# Patient Record
Sex: Male | Born: 1975 | Race: White | Hispanic: No | Smoking: Current every day smoker
Health system: Southern US, Community
[De-identification: ages and names within clinical notes are randomized; demographics above are authoritative.]

## PROBLEM LIST (undated history)

## (undated) DIAGNOSIS — S4992XA Unspecified injury of left shoulder and upper arm, initial encounter: Secondary | ICD-10-CM

## (undated) DIAGNOSIS — M549 Dorsalgia, unspecified: Secondary | ICD-10-CM

## (undated) DIAGNOSIS — G479 Sleep disorder, unspecified: Secondary | ICD-10-CM

---

## 2006-05-17 ENCOUNTER — Emergency Department (HOSPITAL_COMMUNITY): Admission: EM | Admit: 2006-05-17 | Discharge: 2006-05-17 | Payer: Self-pay | Admitting: Emergency Medicine

## 2008-06-09 ENCOUNTER — Emergency Department (HOSPITAL_COMMUNITY): Admission: EM | Admit: 2008-06-09 | Discharge: 2008-06-09 | Payer: Self-pay | Admitting: Emergency Medicine

## 2013-07-18 ENCOUNTER — Emergency Department (HOSPITAL_BASED_OUTPATIENT_CLINIC_OR_DEPARTMENT_OTHER): Payer: Self-pay

## 2013-07-18 ENCOUNTER — Emergency Department (HOSPITAL_BASED_OUTPATIENT_CLINIC_OR_DEPARTMENT_OTHER)
Admission: EM | Admit: 2013-07-18 | Discharge: 2013-07-18 | Disposition: A | Discharge status: Home / Self Care | Payer: Self-pay | Attending: Emergency Medicine | Admitting: Emergency Medicine

## 2013-07-18 ENCOUNTER — Encounter (HOSPITAL_BASED_OUTPATIENT_CLINIC_OR_DEPARTMENT_OTHER): Payer: Self-pay | Admitting: Emergency Medicine

## 2013-07-18 DIAGNOSIS — W010XXA Fall on same level from slipping, tripping and stumbling without subsequent striking against object, initial encounter: Secondary | ICD-10-CM | POA: Insufficient documentation

## 2013-07-18 DIAGNOSIS — Y9289 Other specified places as the place of occurrence of the external cause: Secondary | ICD-10-CM | POA: Insufficient documentation

## 2013-07-18 DIAGNOSIS — Y939 Activity, unspecified: Secondary | ICD-10-CM | POA: Insufficient documentation

## 2013-07-18 DIAGNOSIS — F17200 Nicotine dependence, unspecified, uncomplicated: Secondary | ICD-10-CM | POA: Insufficient documentation

## 2013-07-18 DIAGNOSIS — Y990 Civilian activity done for income or pay: Secondary | ICD-10-CM | POA: Insufficient documentation

## 2013-07-18 DIAGNOSIS — S336XXA Sprain of sacroiliac joint, initial encounter: Secondary | ICD-10-CM | POA: Insufficient documentation

## 2013-07-18 DIAGNOSIS — Z79899 Other long term (current) drug therapy: Secondary | ICD-10-CM | POA: Insufficient documentation

## 2013-07-18 HISTORY — DX: Unspecified injury of left shoulder and upper arm, initial encounter: S49.92XA

## 2013-07-18 MED ORDER — OXYCODONE-ACETAMINOPHEN 5-325 MG PO TABS
2.0000 | ORAL_TABLET | Freq: Once | ORAL | Status: AC
  Administered 2013-07-18: 2 via ORAL
  Filled 2013-07-18: qty 2

## 2013-07-18 MED ORDER — CYCLOBENZAPRINE HCL 10 MG PO TABS: 10.0000 mg | ORAL_TABLET | Freq: Three times a day (TID) | ORAL | Status: DC | PRN

## 2013-07-18 NOTE — ED Provider Notes (Signed)
CSN: [id number]     Arrival date & time 07/18/13  0818 History   First MD Initiated Contact with Patient 07/18/13 [phone]     Chief Complaint  Patient presents with  . Hip Pain   HPI  Brett Stephens is 38 y.o. male presented to the ED after sustaining a fall 3 days ago. He complains of right hip pain 7/10. He describe the pain as a sharp pain in his hip joint that shoot down into his anterior thigh. He report decreased ROM with extension and abduction. Pain is constant . Nothing makes it better, he has tried ibuprofen without relief. He feels his pain is gradually becoming worse.  Movement makes his pain worse. He reports numbness and weakness in his right leg today. He does not recall how he landed on his hip. He plays baseball recreationally and has had an injury to similar location. Patient is on chronic pain and flexeril medications for back pain.   Past Medical History  Diagnosis Date  . Injury of shoulder, left    History reviewed. No pertinent past surgical history. No family history on file. History  Substance Use Topics  . Smoking status: Current Every Day Smoker -- 1.00 packs/day    Types: Cigarettes  . Smokeless tobacco: Not on file  . Alcohol Use: No    Review of Systems  Constitutional: Positive for activity change. Negative for fever, chills and appetite change.  Musculoskeletal: Positive for arthralgias, back pain, gait problem and myalgias. Negative for joint swelling.  Skin: Negative for color change.  Neurological: Positive for weakness and numbness.    Allergies  Review of patient's allergies indicates no known allergies.  Home Medications   Current Outpatient Rx  Name  Route  Sig  Dispense  Refill  . cyclobenzaprine (FLEXERIL) 10 MG tablet   Oral   Take 10 mg by mouth 3 (three) times daily as needed for muscle spasms.         . cyclobenzaprine (FLEXERIL) 10 MG tablet   Oral   Take 1 tablet (10 mg total) by mouth 3 (three) times daily as needed for muscle  spasms.   30 tablet   0   . OxyCODONE HCl ER 30 MG T12A   Oral   Take by mouth.          BP 137/86  Pulse 83  Temp(Src) 97.8 F (36.6 C)  Resp 15  SpO2 99% Physical Exam Gen: NAD. Appears in pain. Uncomfortable sitting.  HEENT: AT. [place]. Bilateral eyes without injections or icterus. MMM.  CV: RRR  Chest: CTAB, no wheeze or crackles Abd: Soft. NTND. BS present. No Masses palpated.  Ext: No erythema. No edema.  Skin: No rashes, purpura or petechiae. Many tattoos.  Neuro: Limp. PERLA. EOMi. Alert. Grossly intact.  MSK: 5/5 strength bilateral LE. Normal ROM with pain. TTP over iliolumbar ligament and right sacrospinous ligament/dorsal sacroiliac . Normal sensation. No bony tenderness lumbar or greater trochanter hip.    ED Course  Procedures (including critical care time) Labs Review Labs Reviewed - No data to display Imaging Review Dg Lumbar Spine Complete  07/18/2013   CLINICAL DATA:  Fall at work 3 days ago. Back pain. Pain radiating down the right leg.  EXAM: LUMBAR SPINE - COMPLETE 4+ VIEW  COMPARISON:  None.  FINDINGS: There are 5 lumbar type vertebral bodies. Lumbar spinal alignment within normal limits. Vertebral body height is preserved. No pars defects are present. Mild disc space narrowing at L4-L5. SI joints  appear within normal limits.  IMPRESSION: Mild L4-L5 disc degeneration.  No acute abnormality.   Electronically Signed   By: Andreas Newport M.D.   On: 07/18/2013 09:03   Dg Hip Complete Right  07/18/2013   CLINICAL DATA:  Fall 3 days ago.  EXAM: RIGHT HIP - COMPLETE 2+ VIEW  COMPARISON:  No comparisons  FINDINGS: There is no evidence of hip fracture or dislocation. There is no evidence of arthropathy or other focal bone abnormality.  IMPRESSION: Negative.   Electronically Signed   By: Andreas Newport M.D.   On: 07/18/2013 09:04     EKG Interpretation None      MDM   Final diagnoses:  Strain of sacroiliac ligament   Patient with likely ligament and muscle  strain. DG xray were without fracture, dislocation or acute process. Patient was given 2 percocet for pain with improvement while in ED. Pt is on chronic pain medications, no additional pain medications were given today. Flexeril was prescribed.  Patient encouraged to follow up with PCP within a week if no improvement. Patient encouraged to rest hip, daily light stretches and light duty at work for 1 week. Patient reports he needs to work and there is no light duty he can perform.     Natalia Leatherwood, DO 07/18/13 [phone]

## 2013-07-18 NOTE — ED Notes (Signed)
MD Wickline at bedside for evaluation. 

## 2013-07-18 NOTE — ED Notes (Signed)
Patient transported to X-ray 

## 2013-07-18 NOTE — ED Notes (Signed)
Pt returned to room via stretcher.

## 2013-07-18 NOTE — ED Provider Notes (Signed)
I have personally seen and examined the patient.  I have discussed the plan of care with the resident.  I have reviewed the documentation on PMH/FH/Soc. History.  I have reviewed the documentation of the resident and agree.   Joya Gaskinsonald W Emslee Lopezmartinez, MD 07/18/13 [phone]

## 2013-07-18 NOTE — Discharge Instructions (Signed)
Strain A strain is an injury to a muscle or the tissue that connects muscles to bones (tendon). In a strain injury, the muscle or tendon is either stretched or torn. Muscles are more susceptible to strains if they cross two joints, such as:  Hamstrings.  Quadriceps.  Calves.  Biceps. There are three categories of strains:  A first-degree strain is a small tear in the muscle. There is no lengthening of the muscle, but pain may be present with contraction of the muscle.  A second-degree strain is a small tear in the muscle accompanied by lengthening of the muscle. Muscles with a second-degree strain are still able to function.  A third-degree strain is a complete tear of the muscle. Muscles with a third-degree strain cannot function properly. Strains often have bleeding and bruising within the muscle. SYMPTOMS   Pain, tenderness, redness or bruising, and swelling in the area of injury.  Loss of normal mobility of the injured joint. CAUSES  A sudden force exerted on a muscle or tendon that it cannot withstand usually causes strains. This may be due to a sudden overload of a contracted muscle, overuse, or sudden increase or change in activity.  RISK INCREASES WITH:  Trauma.  Poor strength and flexibility.  Failure to warm-up properly before activity.  Return to activity before healing is complete. PREVENTION  Warm-up and stretch properly before and activity.  Maintain physical fitness:  Joint flexibility.  Muscle strength.  Endurance and conditioning.  Strengthen weak muscles with exercises to prevent recurrence. PROGNOSIS  If treated properly, strains are usually curable. The time it takes to recover is related to the severity of the injury and usually varies from 2 to 8 weeks. RELATED COMPLICATIONS   Re-injury or recurrence of symptoms, permanent weakness.  Joint stiffness if the strain is severe and rehabilitation is incomplete.  Delayed healing or resolution of  symptoms if sports are resumed before rehabilitation is complete.  Excessive bleeding into muscle, especially if taking anti-inflammatory medicines. This can lead to delayed recovery and injury to nerves, muscle, and blood vessels; this is an emergency. TREATMENT  Treatment initially involves ice and medicine to help reduce pain and inflammation. Use of the affected muscle should be limited by a:  Brace.  Elastic bandage wrapping.  Splint.  Cast.  Sling. Strengthening and stretching exercises may be necessary after immobilization to prevent joint stiffness. These exercises may be completed at home or with a therapist. If the tendon is torn, then surgery may be necessary to repair it.  MEDICATION   Avoid aspirin or ibuprofen in the first 48 hours after the injury. These medicines may increase the tendency to bleed. During this time, you may take pain relievers, such as acetaminophen, that do not affect bleeding.  After the first 48 hours, if pain medicine is necessary, then nonsteroidal anti-inflammatory medicines, such as aspirin and ibuprofen, or other minor pain relievers, such as acetaminophen, are often recommended.  Do not take pain medicine within 7 days before surgery.  Prescription pain relievers may be prescribed. Use only as directed and only as much as you need  Ointments applied to the skin may be helpful. HEAT AND COLD  Cold treatment (icing) relieves pain and reduces inflammation. Cold treatment should be applied for 10 to 15 minutes every 2 to 3 hours for inflammation and pain and immediately after any activity that aggravates your symptoms. Use ice packs or massage the area with a piece of ice (ice massage).  Heat treatment may be   used prior to performing the stretching and strengthening activities prescribed by your caregiver, physical therapist, or athletic trainer. Use a heat pack or soak your injury in warm water. SEEK MEDICAL CARE IF:   Symptoms get worse or do  not improve despite treatment.  Pain becomes intolerable.  You experience numbness or tingling.  Toes or fingernails become cold or develop a blue, gray, or dusky color.  New, unexplained symptoms develop (drugs used in treatment may produce side effects). Document Released: 05/01/2005 Document Revised: 07/24/2011 Document Reviewed: 08/13/2008 Exit[employer] Patient Information 2014 [employer].  

## 2013-07-18 NOTE — ED Provider Notes (Signed)
Patient seen/examined in the Emergency Department in conjunction with [facility] Patient reports right hip pain s/p fall Exam : awake/alert, maex4, no hip deformity noted Plan: imaging and likely d/c home Narcotic database reviewed   Joya Gaskinsonald W Julyssa Kyer, MD 07/18/13 0900

## 2013-07-18 NOTE — ED Notes (Signed)
MD at bedside. 

## 2013-07-18 NOTE — ED Notes (Signed)
Pt reports falling at work 3 days ago where he slipped on mud then fell onto the ground.  Pt woke up this morning with right hip pain that radiates down right leg.  Pt ambulated back to room with a limp.

## 2013-07-29 ENCOUNTER — Ambulatory Visit (HOSPITAL_BASED_OUTPATIENT_CLINIC_OR_DEPARTMENT_OTHER)
Admission: RE | Admit: 2013-07-29 | Discharge: 2013-07-29 | Disposition: A | Discharge status: Home / Self Care | Payer: Self-pay | Source: Ambulatory Visit | Attending: Emergency Medicine | Admitting: Emergency Medicine

## 2013-07-29 ENCOUNTER — Other Ambulatory Visit (HOSPITAL_BASED_OUTPATIENT_CLINIC_OR_DEPARTMENT_OTHER): Payer: Self-pay | Admitting: Emergency Medicine

## 2013-07-29 ENCOUNTER — Emergency Department (HOSPITAL_BASED_OUTPATIENT_CLINIC_OR_DEPARTMENT_OTHER)
Admission: EM | Admit: 2013-07-29 | Discharge: 2013-07-29 | Disposition: A | Discharge status: Home / Self Care | Payer: Self-pay | Attending: Emergency Medicine | Admitting: Emergency Medicine

## 2013-07-29 ENCOUNTER — Encounter (HOSPITAL_BASED_OUTPATIENT_CLINIC_OR_DEPARTMENT_OTHER): Payer: Self-pay | Admitting: Emergency Medicine

## 2013-07-29 DIAGNOSIS — S3994XA Unspecified injury of external genitals, initial encounter: Secondary | ICD-10-CM

## 2013-07-29 DIAGNOSIS — S39848A Other specified injuries of external genitals, initial encounter: Secondary | ICD-10-CM | POA: Insufficient documentation

## 2013-07-29 DIAGNOSIS — N5082 Scrotal pain: Secondary | ICD-10-CM

## 2013-07-29 DIAGNOSIS — Y929 Unspecified place or not applicable: Secondary | ICD-10-CM | POA: Insufficient documentation

## 2013-07-29 DIAGNOSIS — Y939 Activity, unspecified: Secondary | ICD-10-CM | POA: Insufficient documentation

## 2013-07-29 DIAGNOSIS — IMO0002 Reserved for concepts with insufficient information to code with codable children: Secondary | ICD-10-CM | POA: Insufficient documentation

## 2013-07-29 DIAGNOSIS — N508 Other specified disorders of male genital organs: Secondary | ICD-10-CM | POA: Insufficient documentation

## 2013-07-29 DIAGNOSIS — Z87828 Personal history of other (healed) physical injury and trauma: Secondary | ICD-10-CM | POA: Insufficient documentation

## 2013-07-29 DIAGNOSIS — Z79899 Other long term (current) drug therapy: Secondary | ICD-10-CM | POA: Insufficient documentation

## 2013-07-29 DIAGNOSIS — F17200 Nicotine dependence, unspecified, uncomplicated: Secondary | ICD-10-CM | POA: Insufficient documentation

## 2013-07-29 NOTE — ED Notes (Signed)
Dog jumped on pt yesterday  Left testicle swollen and painful

## 2013-07-29 NOTE — ED Notes (Signed)
Dog jumped on pt yesterday hitting left testicle  Increased pain and swelling

## 2013-07-29 NOTE — ED Provider Notes (Signed)
CSN: [id number]     Arrival date & time 07/29/13  [id number] History   First MD Initiated Contact with Patient 07/29/13 0531     Chief Complaint  Patient presents with  . Testicle Injury     (Consider location/radiation/quality/duration/timing/severity/associated sxs/prior Treatment) HPI This is a [age] year old male who was struck in the left scrotum by his dog yesterday afternoon. He is subsequently developed pain and swelling of the left hemiscrotum. The pain is moderate and worse with palpation or movement. He is not having hematuria. He has had sexual intercourse since the injury. He denies any abdominal pain, nausea or vomiting. He is having some mild pain in his left spermatic cord.  Past Medical History  Diagnosis Date  . Injury of shoulder, left    History reviewed. No pertinent past surgical history. No family history on file. History  Substance Use Topics  . Smoking status: Current Every Day Smoker -- 1.00 packs/day    Types: Cigarettes  . Smokeless tobacco: Not on file  . Alcohol Use: No    Review of Systems  All other systems reviewed and are negative.      Allergies  Review of patient's allergies indicates no known allergies.  Home Medications   Current Outpatient Rx  Name  Route  Sig  Dispense  Refill  . cyclobenzaprine (FLEXERIL) 10 MG tablet   Oral   Take 10 mg by mouth 3 (three) times daily as needed for muscle spasms.         . cyclobenzaprine (FLEXERIL) 10 MG tablet   Oral   Take 1 tablet (10 mg total) by mouth 3 (three) times daily as needed for muscle spasms.   30 tablet   0   . OxyCODONE HCl ER 30 MG T12A   Oral   Take by mouth.          BP 141/94  Pulse 109  Temp(Src) 98.2 F (36.8 C) (Oral)  Resp 16  SpO2 97%  Physical Exam General: Well-developed, well-nourished male in no acute distress; appearance consistent with age of record HENT: normocephalic; atraumatic Eyes: pupils equal, round and reactive to light; extraocular muscles  intact Neck: supple Heart: regular rate and rhythm Lungs: clear to auscultation bilaterally Abdomen: soft; nondistended; nontender; no masses or hepatosplenomegaly; bowel sounds present GU: Tanner 4 male, circumcised; no blood or discharge at urethral meatus; right testicle and hemiscrotum normal; left testicle palpable within the edematous or fluid filled left hemiscrotum; no hernia palpated; left testicle mild to moderately tender, no obvious torsion Extremities: No deformity; full range of motion Neurologic: Awake, alert and oriented; motor function intact in all extremities and symmetric; no facial droop Skin: Warm and dry Psychiatric: Normal mood and affect    ED Course  Procedures (including critical care time)    MDM  The patient is mild to moderate discomfort makes me less concerned about testicular rupture or hematoma of the testicle itself. We will however obtain an ultrasound of his scrotum this morning. We could not perform a bedside ultrasound because the machine is out of order.      Hanley SeamenJohn L Reign Bartnick, MD 07/29/13 [phone]

## 2013-07-31 ENCOUNTER — Emergency Department (HOSPITAL_BASED_OUTPATIENT_CLINIC_OR_DEPARTMENT_OTHER): Payer: Self-pay

## 2013-07-31 ENCOUNTER — Encounter (HOSPITAL_BASED_OUTPATIENT_CLINIC_OR_DEPARTMENT_OTHER): Payer: Self-pay | Admitting: Emergency Medicine

## 2013-07-31 ENCOUNTER — Emergency Department (HOSPITAL_BASED_OUTPATIENT_CLINIC_OR_DEPARTMENT_OTHER)
Admission: EM | Admit: 2013-07-31 | Discharge: 2013-07-31 | Disposition: A | Discharge status: Home / Self Care | Payer: Self-pay | Attending: Emergency Medicine | Admitting: Emergency Medicine

## 2013-07-31 DIAGNOSIS — F17200 Nicotine dependence, unspecified, uncomplicated: Secondary | ICD-10-CM | POA: Insufficient documentation

## 2013-07-31 DIAGNOSIS — Z87828 Personal history of other (healed) physical injury and trauma: Secondary | ICD-10-CM | POA: Insufficient documentation

## 2013-07-31 DIAGNOSIS — N451 Epididymitis: Secondary | ICD-10-CM

## 2013-07-31 DIAGNOSIS — N453 Epididymo-orchitis: Secondary | ICD-10-CM | POA: Insufficient documentation

## 2013-07-31 LAB — BASIC METABOLIC PANEL
BUN: 15 mg/dL (ref 6–23)
CHLORIDE: 98 meq/L (ref 96–112)
CO2: 26 mEq/L (ref 19–32)
Calcium: 9.6 mg/dL (ref 8.4–10.5)
Creatinine, Ser: 1.1 mg/dL (ref 0.50–1.35)
GFR, EST NON AFRICAN AMERICAN: 86 mL/min — AB (ref >90)
GLUCOSE: 178 mg/dL — AB (ref 70–99)
POTASSIUM: 3.8 meq/L (ref 3.7–5.3)
SODIUM: 141 meq/L (ref 137–147)

## 2013-07-31 LAB — URINALYSIS, ROUTINE W REFLEX MICROSCOPIC
BILIRUBIN URINE: NEGATIVE
Glucose, UA: NEGATIVE mg/dL
Hgb urine dipstick: NEGATIVE
Ketones, ur: NEGATIVE mg/dL
NITRITE: NEGATIVE
PH: 6 (ref 5.0–8.0)
Protein, ur: NEGATIVE mg/dL
SPECIFIC GRAVITY, URINE: 1.024 (ref 1.005–1.030)
UROBILINOGEN UA: 0.2 mg/dL (ref 0.0–1.0)

## 2013-07-31 LAB — CBC WITH DIFFERENTIAL/PLATELET
BASOS PCT: 0 % (ref 0–1)
Basophils Absolute: 0 10*3/uL (ref 0.0–0.1)
EOS ABS: 0.1 10*3/uL (ref 0.0–0.7)
Eosinophils Relative: 2 % (ref 0–5)
HCT: 43.5 % (ref 39.0–52.0)
HEMOGLOBIN: 14.8 g/dL (ref 13.0–17.0)
LYMPHS ABS: 1.6 10*3/uL (ref 0.7–4.0)
Lymphocytes Relative: 16 % (ref 12–46)
MCH: 29.2 pg (ref 26.0–34.0)
MCHC: 34 g/dL (ref 30.0–36.0)
MCV: 85.8 fL (ref 78.0–100.0)
MONOS PCT: 10 % (ref 3–12)
Monocytes Absolute: 0.9 10*3/uL (ref 0.1–1.0)
NEUTROS ABS: 7 10*3/uL (ref 1.7–7.7)
NEUTROS PCT: 72 % (ref 43–77)
PLATELETS: 177 10*3/uL (ref 150–400)
RBC: 5.07 MIL/uL (ref 4.22–5.81)
RDW: 13.9 % (ref 11.5–15.5)
WBC: 9.7 10*3/uL (ref 4.0–10.5)

## 2013-07-31 LAB — URINE MICROSCOPIC-ADD ON

## 2013-07-31 MED ORDER — DEXTROSE 5 % IV SOLN
1.0000 g | Freq: Once | INTRAVENOUS | Status: AC
  Administered 2013-07-31: 1 g via INTRAVENOUS

## 2013-07-31 MED ORDER — HYDROMORPHONE HCL PF 1 MG/ML IJ SOLN
1.0000 mg | Freq: Once | INTRAMUSCULAR | Status: AC
  Administered 2013-07-31: 1 mg via INTRAVENOUS
  Filled 2013-07-31: qty 1

## 2013-07-31 MED ORDER — DOXYCYCLINE HYCLATE 100 MG PO CAPS: 100.0000 mg | ORAL_CAPSULE | Freq: Two times a day (BID) | ORAL | Status: DC

## 2013-07-31 MED ORDER — CEPHALEXIN 500 MG PO CAPS: 500.0000 mg | ORAL_CAPSULE | Freq: Four times a day (QID) | ORAL | Status: DC

## 2013-07-31 MED ORDER — ONDANSETRON HCL 4 MG/2ML IJ SOLN
4.0000 mg | Freq: Once | INTRAMUSCULAR | Status: AC
  Administered 2013-07-31: 4 mg via INTRAVENOUS
  Filled 2013-07-31: qty 2

## 2013-07-31 MED ORDER — CEFTRIAXONE SODIUM 1 G IJ SOLR
INTRAMUSCULAR | Status: AC
  Filled 2013-07-31: qty 10

## 2013-07-31 MED ORDER — HYDROMORPHONE HCL PF 2 MG/ML IJ SOLN
2.0000 mg | Freq: Once | INTRAMUSCULAR | Status: AC
  Administered 2013-07-31: 2 mg via INTRAVENOUS
  Filled 2013-07-31: qty 1

## 2013-07-31 MED ORDER — IBUPROFEN 800 MG PO TABS: 800.0000 mg | ORAL_TABLET | Freq: Three times a day (TID) | ORAL | Status: DC

## 2013-07-31 MED ORDER — KETOROLAC TROMETHAMINE 30 MG/ML IJ SOLN
30.0000 mg | Freq: Once | INTRAMUSCULAR | Status: AC
  Administered 2013-07-31: 30 mg via INTRAVENOUS
  Filled 2013-07-31: qty 1

## 2013-07-31 NOTE — ED Provider Notes (Signed)
CSN: [id number]     Arrival date & time 07/31/13  0848 History   First MD Initiated Contact with Patient 07/31/13 [phone]     Chief Complaint  Patient presents with  . Testicle Pain     (Consider location/radiation/quality/duration/timing/severity/associated sxs/prior Treatment) HPI Comments: Patient returns to the ER for persistent and worsening left testicle pain. Patient suffered an injury to the testicle 3 days ago. Patient reports that his daughter took upon him and hit his left testicle. He was seen in the ER and treated with analgesia. Patient reports that the pain, redness and swelling of the left testicle has worsened since that time. He denies urethral discharge. He has not had any dysuria.  Patient is a 38 y.o. male presenting with testicular pain.  Testicle Pain    Past Medical History  Diagnosis Date  . Injury of shoulder, left    History reviewed. No pertinent past surgical history. No family history on file. History  Substance Use Topics  . Smoking status: Current Every Day Smoker -- 1.00 packs/day    Types: Cigarettes  . Smokeless tobacco: Not on file  . Alcohol Use: No    Review of Systems  Genitourinary: Positive for testicular pain.  All other systems reviewed and are negative.      Allergies  Review of patient's allergies indicates no known allergies.  Home Medications   Current Outpatient Rx  Name  Route  Sig  Dispense  Refill  . cyclobenzaprine (FLEXERIL) 10 MG tablet   Oral   Take 10 mg by mouth 3 (three) times daily as needed for muscle spasms.         . cyclobenzaprine (FLEXERIL) 10 MG tablet   Oral   Take 1 tablet (10 mg total) by mouth 3 (three) times daily as needed for muscle spasms.   30 tablet   0   . OxyCODONE HCl ER 30 MG T12A   Oral   Take by mouth.          BP 172/86  Pulse 115  Temp(Src) 99.4 F (37.4 C) (Oral)  Resp 20  Ht 6\' 5"  (1.956 m)  Wt 220 lb (99.791 kg)  BMI 26.08 kg/m2  SpO2 99% Physical Exam    Constitutional: He is oriented to person, place, and time. He appears well-developed and well-nourished. He appears distressed.  HENT:  Head: Normocephalic and atraumatic.  Right Ear: Hearing normal.  Left Ear: Hearing normal.  Nose: Nose normal.  Mouth/Throat: Oropharynx is clear and moist and mucous membranes are normal.  Eyes: Conjunctivae and EOM are normal. Pupils are equal, round, and reactive to light.  Neck: Normal range of motion. Neck supple.  Cardiovascular: Regular rhythm, S1 normal and S2 normal.  Exam reveals no gallop and no friction rub.   No murmur heard. Pulmonary/Chest: Effort normal and breath sounds normal. No respiratory distress. He exhibits no tenderness.  Abdominal: Soft. Normal appearance and bowel sounds are normal. There is no hepatosplenomegaly. There is no tenderness. There is no rebound, no guarding, no tenderness at McBurney's point and negative Murphy's sign. No hernia. Hernia confirmed negative in the left inguinal area.  Genitourinary:    Left testis shows swelling and tenderness. Left testis shows no mass.  Musculoskeletal: Normal range of motion.  Neurological: He is alert and oriented to person, place, and time. He has normal strength. No cranial nerve deficit or sensory deficit. Coordination normal. GCS eye subscore is 4. GCS verbal subscore is 5. GCS motor subscore is 6.  Skin: Skin is warm, dry and intact. No rash noted. No cyanosis.  Psychiatric: He has a normal mood and affect. His speech is normal and behavior is normal. Thought content normal.    ED Course  Procedures (including critical care time) Labs Review Labs Reviewed  CBC WITH DIFFERENTIAL  BASIC METABOLIC PANEL   Imaging Review [place] Scrotum  07/29/2013   CLINICAL DATA:  Left testicular trauma yesterday  EXAM: SCROTAL ULTRASOUND  DOPPLER ULTRASOUND OF THE TESTICLES  TECHNIQUE: Complete ultrasound examination of the testicles, epididymis, and other scrotal structures was performed.  Color and spectral Doppler ultrasound were also utilized to evaluate blood flow to the testicles.  COMPARISON:  None.  FINDINGS: Right testicle  Measurements: 4.7 x 2.4 x 3.0 cm. No mass or microlithiasis visualized.  Left testicle  Measurements: 4.2 x 2.6 x 3.0 cm. No mass or microlithiasis visualized.  Right epididymis:  Normal in size and appearance.  Left epididymis:  Enlarged and hypervascular.  Hydrocele:  Physiologic fluid bilaterally.  Varicocele:  None visualized.  Pulsed Doppler interrogation of both testes demonstrates low resistance arterial and venous waveforms bilaterally.  IMPRESSION: Enlarged/hypervascular left epididymis, suspicious for epididymitis, likely post-traumatic given the clinical history.  No evidence of testicular torsion.   Electronically Signed   By: Charline BillsSriyesh  Krishnan M.D.   On: 07/29/2013 09:24   [place] Art/ven Flow Abd Pelv Doppler  07/29/2013   CLINICAL DATA:  Left testicular trauma yesterday  EXAM: SCROTAL ULTRASOUND  DOPPLER ULTRASOUND OF THE TESTICLES  TECHNIQUE: Complete ultrasound examination of the testicles, epididymis, and other scrotal structures was performed. Color and spectral Doppler ultrasound were also utilized to evaluate blood flow to the testicles.  COMPARISON:  None.  FINDINGS: Right testicle  Measurements: 4.7 x 2.4 x 3.0 cm. No mass or microlithiasis visualized.  Left testicle  Measurements: 4.2 x 2.6 x 3.0 cm. No mass or microlithiasis visualized.  Right epididymis:  Normal in size and appearance.  Left epididymis:  Enlarged and hypervascular.  Hydrocele:  Physiologic fluid bilaterally.  Varicocele:  None visualized.  Pulsed Doppler interrogation of both testes demonstrates low resistance arterial and venous waveforms bilaterally.  IMPRESSION: Enlarged/hypervascular left epididymis, suspicious for epididymitis, likely post-traumatic given the clinical history.  No evidence of testicular torsion.   Electronically Signed   By: Charline BillsSriyesh  Krishnan M.D.   On:  07/29/2013 09:24     EKG Interpretation None      MDM   Final diagnoses:  None    Patient presents to the ER for evaluation of testicular pain. Patient was seen 2 days ago for left testicle pain. This seemed to start after the patient's dog and hit his testicle. An ultrasound was performed and there were no acute abnormalities other than increased flow to the epididymis. At that time it was thought that that was posttraumatic. Patient's symptoms have worsened. He is now experiencing some redness, swelling and thickening of the rectum overlying the left testicle as well as significant testicle pain and tenderness. Repeat ultrasound once again shows normal blood flow, no evidence of torsion. This has to be considered as possible infectious at this point. Patient will be initiated on antibiotic coverage. He was administered Rocephin here in the ER and will be prescribed doxycycline and keflex. Followup with urology.    Gilda Creasehristopher J. Pollina, MD 07/31/13 [phone]

## 2013-07-31 NOTE — Discharge Instructions (Signed)
Epididymitis  Epididymitis is a swelling (inflammation) of the epididymis. The epididymis is a cord-like structure along the back part of the testicle. Epididymitis is usually, but not always, caused by infection. This is usually a sudden problem beginning with chills, fever and pain behind the scrotum and in the testicle. There may be swelling and redness of the testicle.  DIAGNOSIS   Physical examination will reveal a tender, swollen epididymis. Sometimes, cultures are obtained from the urine or from prostate secretions to help find out if there is an infection or if the cause is a different problem. Sometimes, blood work is performed to see if your white blood cell count is elevated and if a germ (bacterial) or viral infection is present. Using this knowledge, an appropriate medicine which kills germs (antibiotic) can be chosen by your caregiver. A viral infection causing epididymitis will most often go away (resolve) without treatment.  HOME CARE INSTRUCTIONS   · Hot sitz baths for 20 minutes, 4 times per day, may help relieve pain.  · Only take over-the-counter or prescription medicines for pain, discomfort or fever as directed by your caregiver.  · Take all medicines, including antibiotics, as directed. Take the antibiotics for the full prescribed length of time even if you are feeling better.  · It is very important to keep all follow-up appointments.  SEEK IMMEDIATE MEDICAL CARE IF:   · You have a fever.  · You have pain not relieved with medicines.  · You have any worsening of your problems.  · Your pain seems to come and go.  · You develop pain, redness, and swelling in the scrotum and surrounding areas.  MAKE SURE YOU:   · Understand these instructions.  · Will watch your condition.  · Will get help right away if you are not doing well or get worse.  Document Released: 04/28/2000 Document Revised: 07/24/2011 Document Reviewed: 03/18/2009  [employer]® Patient Information ©2014 [employer].

## 2013-07-31 NOTE — ED Notes (Signed)
Patient states he was seen on Tuesday for L side testicle pain, and still having swelling and pain.

## 2013-10-23 ENCOUNTER — Encounter (HOSPITAL_BASED_OUTPATIENT_CLINIC_OR_DEPARTMENT_OTHER): Payer: Self-pay | Admitting: Emergency Medicine

## 2013-10-23 ENCOUNTER — Emergency Department (HOSPITAL_BASED_OUTPATIENT_CLINIC_OR_DEPARTMENT_OTHER)
Admission: EM | Admit: 2013-10-23 | Discharge: 2013-10-24 | Disposition: A | Discharge status: Home / Self Care | Payer: Self-pay | Attending: Emergency Medicine | Admitting: Emergency Medicine

## 2013-10-23 ENCOUNTER — Emergency Department (HOSPITAL_BASED_OUTPATIENT_CLINIC_OR_DEPARTMENT_OTHER)
Admission: EM | Admit: 2013-10-23 | Discharge: 2013-10-23 | Discharge status: Against Medical Advice | Payer: Self-pay | Attending: Emergency Medicine | Admitting: Emergency Medicine

## 2013-10-23 ENCOUNTER — Emergency Department (HOSPITAL_BASED_OUTPATIENT_CLINIC_OR_DEPARTMENT_OTHER): Payer: Self-pay

## 2013-10-23 DIAGNOSIS — Y9364 Activity, baseball: Secondary | ICD-10-CM | POA: Insufficient documentation

## 2013-10-23 DIAGNOSIS — Z87828 Personal history of other (healed) physical injury and trauma: Secondary | ICD-10-CM | POA: Insufficient documentation

## 2013-10-23 DIAGNOSIS — X500XXA Overexertion from strenuous movement or load, initial encounter: Secondary | ICD-10-CM | POA: Insufficient documentation

## 2013-10-23 DIAGNOSIS — X58XXXA Exposure to other specified factors, initial encounter: Secondary | ICD-10-CM | POA: Insufficient documentation

## 2013-10-23 DIAGNOSIS — S8990XA Unspecified injury of unspecified lower leg, initial encounter: Secondary | ICD-10-CM | POA: Insufficient documentation

## 2013-10-23 DIAGNOSIS — F17200 Nicotine dependence, unspecified, uncomplicated: Secondary | ICD-10-CM | POA: Insufficient documentation

## 2013-10-23 DIAGNOSIS — Y92838 Other recreation area as the place of occurrence of the external cause: Secondary | ICD-10-CM

## 2013-10-23 DIAGNOSIS — S99929A Unspecified injury of unspecified foot, initial encounter: Principal | ICD-10-CM

## 2013-10-23 DIAGNOSIS — Y929 Unspecified place or not applicable: Secondary | ICD-10-CM | POA: Insufficient documentation

## 2013-10-23 DIAGNOSIS — S92109A Unspecified fracture of unspecified talus, initial encounter for closed fracture: Secondary | ICD-10-CM | POA: Insufficient documentation

## 2013-10-23 DIAGNOSIS — Y9239 Other specified sports and athletic area as the place of occurrence of the external cause: Secondary | ICD-10-CM | POA: Insufficient documentation

## 2013-10-23 DIAGNOSIS — S92151A Displaced avulsion fracture (chip fracture) of right talus, initial encounter for closed fracture: Secondary | ICD-10-CM

## 2013-10-23 DIAGNOSIS — Z79899 Other long term (current) drug therapy: Secondary | ICD-10-CM | POA: Insufficient documentation

## 2013-10-23 DIAGNOSIS — S99919A Unspecified injury of unspecified ankle, initial encounter: Principal | ICD-10-CM

## 2013-10-23 HISTORY — DX: Sleep disorder, unspecified: G47.9

## 2013-10-23 HISTORY — DX: Dorsalgia, unspecified: M54.9

## 2013-10-23 MED ORDER — IBUPROFEN 800 MG PO TABS
800.0000 mg | ORAL_TABLET | Freq: Once | ORAL | Status: AC
  Administered 2013-10-24: 800 mg via ORAL
  Filled 2013-10-23: qty 1

## 2013-10-23 NOTE — ED Notes (Addendum)
Right ankle injury while playing baseball tonight.  Was here earlier and LWBS after xr.  Called back due to confirmation of fx.

## 2013-10-23 NOTE — Discharge Instructions (Signed)
Ankle Fracture °A fracture is a break in a bone. A cast or splint may be used to protect the ankle and heal the break. Sometimes, surgery is needed. °HOME CARE °· Use crutches as told by your doctor. It is very important that you use your crutches correctly. °· Do not put weight or pressure on the injured ankle until told by your doctor. °· Keep your ankle raised (elevated) when sitting or lying down. °· Apply ice to the ankle: °· Put ice in a plastic bag. °· Place a towel between your cast and the bag. °· Leave the ice on for 20 minutes, 2 3 times a day. °· If you have a plaster or fiberglass cast: °· Do not try to scratch under the cast with any objects. °· Check the skin around the cast every day. You may put lotion on red or sore areas. °· Keep your cast dry and clean. °· If you have a plaster splint: °· Wear the splint as told by your doctor. °· You can loosen the elastic around the splint if your toes get numb, tingle, or turn cold or blue. °· Do not put pressure on any part of your cast or splint. It may break. Rest your plaster splint or cast only on a pillow the first 24 hours until it is fully hardened. °· Cover your cast or splint with a plastic bag during showers. °· Do not lower your cast or splint into water. °· Take medicine as told by your doctor. °· Do not drive until your doctor says it is safe. °· Follow-up with your doctor as told. It is very important that you go to your follow-up visits. °GET HELP IF: °The swelling and discomfort gets worse.  °GET HELP RIGHT AWAY IF:  °· Your splint or cast breaks. °· You continue to have very bad pain. °· You have new pain or swelling after your splint or cast was put on. °· Your skin or toes below the injured ankle: °· Turn blue or gray. °· Feel cold, numb, or you cannot feel them. °· There is a bad smell or yellowish white fluid (pus) coming from under the splint or cast. °MAKE SURE YOU:  °· Understand these instructions. °· Will watch your  condition. °· Will get help right away if you are not doing well or get worse. °Document Released: 02/26/2009 Document Revised: 02/19/2013 Document Reviewed: 11/28/2012 °ExitCare® Patient Information ©2014 [employer]. ° °

## 2013-10-23 NOTE — ED Notes (Signed)
Playing baseball and twisted right ankle while running.  Pain from ankle to toes.

## 2013-10-23 NOTE — ED Provider Notes (Signed)
CSN: [id number]     Arrival date & time 10/23/13  2232 History   First MD Initiated Contact with Patient 10/23/13 2338     Chief Complaint  Patient presents with  . Ankle Injury     (Consider location/radiation/quality/duration/timing/severity/associated sxs/prior Treatment) HPI Comments: Patient playing baseball and slid into first base twisting ankle and hearing pop.  Patient with swelling and pain to lateral right ankle.   Patient is a 38 y.o. male presenting with lower extremity injury. The history is provided by the patient.  Ankle Injury This is a new problem. The current episode started 3 to 5 hours ago. The problem occurs constantly. The problem has not changed since onset.Pertinent negatives include no chest pain and no abdominal pain. The symptoms are aggravated by walking. Nothing relieves the symptoms. He has tried nothing for the symptoms. The treatment provided no relief.    Past Medical History  Diagnosis Date  . Injury of shoulder, left   . Sleep disorder   . Back pain    History reviewed. No pertinent past surgical history. No family history on file. History  Substance Use Topics  . Smoking status: Current Every Day Smoker -- 2.00 packs/day    Types: Cigarettes  . Smokeless tobacco: Not on file  . Alcohol Use: No    Review of Systems  Cardiovascular: Negative for chest pain.  Gastrointestinal: Negative for abdominal pain.  All other systems reviewed and are negative.     Allergies  Review of patient's allergies indicates no known allergies.  Home Medications   Prior to Admission medications   Medication Sig Start Date End Date Taking? Authorizing Provider  cephALEXin (KEFLEX) 500 MG capsule Take 1 capsule (500 mg total) by mouth 4 (four) times daily. 07/31/13   Gilda Crease, MD  cyclobenzaprine (FLEXERIL) 10 MG tablet Take 10 mg by mouth 3 (three) times daily as needed for muscle spasms.    Historical Provider, MD  cyclobenzaprine (FLEXERIL)  10 MG tablet Take 1 tablet (10 mg total) by mouth 3 (three) times daily as needed for muscle spasms. 07/18/13   Renee A Kuneff, DO  doxycycline (VIBRAMYCIN) 100 MG capsule Take 1 capsule (100 mg total) by mouth 2 (two) times daily. 07/31/13   Gilda Crease, MD  ibuprofen (ADVIL,MOTRIN) 800 MG tablet Take 1 tablet (800 mg total) by mouth 3 (three) times daily. 07/31/13   Gilda Crease, MD  OxyCODONE HCl ER 30 MG T12A Take by mouth.    Historical Provider, MD   There were no vitals taken for this visit. Physical Exam  Vitals reviewed. Constitutional: He appears well-developed and well-nourished.  HENT:  Head: Normocephalic and atraumatic.  Musculoskeletal:       Feet:  There were no vitals filed for this visit. There were no vitals filed for this visit. Distal pulses intact Vital signs from earlier this evening seen and reviewed.  ED Course  Procedures (including critical care time) Labs Review Labs Reviewed - No data to display  Imaging Review Dg Ankle Complete Right  10/23/2013   CLINICAL DATA:  Right ankle pain following injury  EXAM: RIGHT ANKLE - COMPLETE 3+ VIEW  COMPARISON:  None.  FINDINGS: A tiny bony densities are noted adjacent to the lateral aspect of the talus likely representing small avulsion fractures. Associated soft tissue swelling is seen. No other focal abnormality is noted.  IMPRESSION: Small avulsion fractures from the lateral talus.   Electronically Signed   By: Eulah Pont.D.  On: 10/23/2013 21:43   Dg Foot Complete Right  10/23/2013   CLINICAL DATA:  Right foot pain following injury  EXAM: RIGHT FOOT COMPLETE - 3+ VIEW  COMPARISON:  None.  FINDINGS: There is no evidence of fracture or dislocation. There is no evidence of arthropathy or other focal bone abnormality. Soft tissues are unremarkable.  IMPRESSION: No acute abnormality noted.   Electronically Signed   By: Alcide CleverMark  Lukens M.D.   On: 10/23/2013 21:44     EKG Interpretation None       MDM   Final diagnoses:  Avulsion fracture of right talus       Hilario Quarryanielle S Jamyria Ozanich, MD 10/23/13 2349

## 2013-10-24 ENCOUNTER — Ambulatory Visit (INDEPENDENT_AMBULATORY_CARE_PROVIDER_SITE_OTHER): Payer: Self-pay | Admitting: Family Medicine

## 2013-10-24 ENCOUNTER — Encounter: Payer: Self-pay | Admitting: Family Medicine

## 2013-10-24 VITALS — BP 118/81 | HR 86 | Ht 77.0 in | Wt 210.0 lb

## 2013-10-24 DIAGNOSIS — S99929A Unspecified injury of unspecified foot, initial encounter: Secondary | ICD-10-CM

## 2013-10-24 DIAGNOSIS — S99919A Unspecified injury of unspecified ankle, initial encounter: Secondary | ICD-10-CM

## 2013-10-24 DIAGNOSIS — S99921A Unspecified injury of right foot, initial encounter: Secondary | ICD-10-CM

## 2013-10-24 DIAGNOSIS — S8990XA Unspecified injury of unspecified lower leg, initial encounter: Secondary | ICD-10-CM

## 2013-10-24 NOTE — Patient Instructions (Signed)
You have an avulsion fracture of your talus. Wear cam walker when up and walking around regularly for next 6 weeks. Take your oxycodone as you have been. If it doesn't have tylenol in it take tylenol 500mg  1-2 tabs three times a day. Ibuprofen 600mg  3-4 times a day with food. Capsaicin topically up to 4 times a day for pain. Follow up with me in 6 weeks. These typically heal very well and you can put weight on this.

## 2013-10-28 ENCOUNTER — Encounter: Payer: Self-pay | Admitting: Family Medicine

## 2013-10-28 ENCOUNTER — Emergency Department (HOSPITAL_BASED_OUTPATIENT_CLINIC_OR_DEPARTMENT_OTHER)
Admission: EM | Admit: 2013-10-28 | Discharge: 2013-10-28 | Disposition: A | Discharge status: Home / Self Care | Payer: Self-pay | Attending: Emergency Medicine | Admitting: Emergency Medicine

## 2013-10-28 ENCOUNTER — Emergency Department (HOSPITAL_BASED_OUTPATIENT_CLINIC_OR_DEPARTMENT_OTHER): Payer: Self-pay

## 2013-10-28 ENCOUNTER — Encounter (HOSPITAL_BASED_OUTPATIENT_CLINIC_OR_DEPARTMENT_OTHER): Payer: Self-pay | Admitting: Emergency Medicine

## 2013-10-28 DIAGNOSIS — Y929 Unspecified place or not applicable: Secondary | ICD-10-CM | POA: Insufficient documentation

## 2013-10-28 DIAGNOSIS — X58XXXA Exposure to other specified factors, initial encounter: Secondary | ICD-10-CM | POA: Insufficient documentation

## 2013-10-28 DIAGNOSIS — Y939 Activity, unspecified: Secondary | ICD-10-CM | POA: Insufficient documentation

## 2013-10-28 DIAGNOSIS — S82899A Other fracture of unspecified lower leg, initial encounter for closed fracture: Secondary | ICD-10-CM

## 2013-10-28 DIAGNOSIS — S99921A Unspecified injury of right foot, initial encounter: Secondary | ICD-10-CM | POA: Insufficient documentation

## 2013-10-28 DIAGNOSIS — F17200 Nicotine dependence, unspecified, uncomplicated: Secondary | ICD-10-CM | POA: Insufficient documentation

## 2013-10-28 DIAGNOSIS — R21 Rash and other nonspecific skin eruption: Secondary | ICD-10-CM | POA: Insufficient documentation

## 2013-10-28 DIAGNOSIS — Z791 Long term (current) use of non-steroidal anti-inflammatories (NSAID): Secondary | ICD-10-CM | POA: Insufficient documentation

## 2013-10-28 DIAGNOSIS — S8263XA Displaced fracture of lateral malleolus of unspecified fibula, initial encounter for closed fracture: Secondary | ICD-10-CM | POA: Insufficient documentation

## 2013-10-28 DIAGNOSIS — Z792 Long term (current) use of antibiotics: Secondary | ICD-10-CM | POA: Insufficient documentation

## 2013-10-28 MED ORDER — HYDROCODONE-ACETAMINOPHEN 5-325 MG PO TABS: 1.0000 | ORAL_TABLET | Freq: Four times a day (QID) | ORAL | Status: AC | PRN

## 2013-10-28 MED ORDER — OXYCODONE-ACETAMINOPHEN 5-325 MG PO TABS
1.0000 | ORAL_TABLET | Freq: Once | ORAL | Status: AC
  Administered 2013-10-28: 1 via ORAL

## 2013-10-28 MED ORDER — IBUPROFEN 600 MG PO TABS: 600.0000 mg | ORAL_TABLET | Freq: Four times a day (QID) | ORAL | Status: AC | PRN

## 2013-10-28 MED ORDER — OXYCODONE-ACETAMINOPHEN 5-325 MG PO TABS
ORAL_TABLET | ORAL | Status: AC
  Filled 2013-10-28: qty 1

## 2013-10-28 NOTE — Discharge Instructions (Signed)
Please follow the recommendations given to you by Dr. Pearletha ForgeHudnall - otherwise you will not heal well, and have a limp and pain for rest of your life.  RICE: Routine Care for Injuries The routine care of many injuries includes Rest, Ice, Compression, and Elevation (RICE). HOME CARE INSTRUCTIONS  Rest is needed to allow your body to heal. Routine activities can usually be resumed when comfortable. Injured tendons and bones can take up to 6 weeks to heal. Tendons are the cord-like structures that attach muscle to bone.  Ice following an injury helps keep the swelling down and reduces pain.  Put ice in a plastic bag.  Place a towel between your skin and the bag.  Leave the ice on for 15-20 minutes, 03-04 times a day. Do this while awake, for the first 24 to 48 hours. After that, continue as directed by your caregiver.  Compression helps keep swelling down. It also gives support and helps with discomfort. If an elastic bandage has been applied, it should be removed and reapplied every 3 to 4 hours. It should not be applied tightly, but firmly enough to keep swelling down. Watch fingers or toes for swelling, bluish discoloration, coldness, numbness, or excessive pain. If any of these problems occur, remove the bandage and reapply loosely. Contact your caregiver if these problems continue.  Elevation helps reduce swelling and decreases pain. With extremities, such as the arms, hands, legs, and feet, the injured area should be placed near or above the level of the heart, if possible. SEEK IMMEDIATE MEDICAL CARE IF:  You have persistent pain and swelling.  You develop redness, numbness, or unexpected weakness.  Your symptoms are getting worse rather than improving after several days. These symptoms may indicate that further evaluation or further X-rays are needed. Sometimes, X-rays may not show a small broken bone (fracture) until 1 week or 10 days later. Make a follow-up appointment with your  caregiver. Ask when your X-ray results will be ready. Make sure you get your X-ray results. Document Released: 08/13/2000 Document Revised: 07/24/2011 Document Reviewed: 09/30/2010 [facility] Patient Information 2014 [place]ExitCare, [place].

## 2013-10-28 NOTE — ED Notes (Signed)
Pt seen here 6/12 FX foo tf/u with [facility] on fri , pt took off camwaker and went to work today

## 2013-10-28 NOTE — ED Provider Notes (Signed)
CSN: [id number]     Arrival date & time 10/28/13  0048 History   First MD Initiated Contact with Patient 10/28/13 0127     Chief Complaint  Patient presents with  . Foot Pain     (Consider location/radiation/quality/duration/timing/severity/associated sxs/prior Treatment) HPI Comments: Pt recently diagnosed with right ankle avulsion fracture. Discharged to Dr. Pearletha ForgeHudnall, who gave a cam walker boot to the patient. Patient reports going to work, and just applying ace wrap. Pain has increased over time this evening and so has swelling.  Patient is a 38 y.o. male presenting with lower extremity pain. The history is provided by the patient.  Foot Pain    Past Medical History  Diagnosis Date  . Injury of shoulder, left   . Sleep disorder   . Back pain    History reviewed. No pertinent past surgical history. History reviewed. No pertinent family history. History  Substance Use Topics  . Smoking status: Current Every Day Smoker -- 2.00 packs/day    Types: Cigarettes  . Smokeless tobacco: Not on file  . Alcohol Use: No    Review of Systems  Constitutional: Positive for activity change.  Musculoskeletal: Positive for arthralgias.  Skin: Positive for rash. Negative for wound.      Allergies  Review of patient's allergies indicates no known allergies.  Home Medications   Prior to Admission medications   Medication Sig Start Date End Date Taking? Authorizing Provider  cephALEXin (KEFLEX) 500 MG capsule Take 1 capsule (500 mg total) by mouth 4 (four) times daily. 07/31/13   Gilda Creasehristopher J. Pollina, MD  cyclobenzaprine (FLEXERIL) 10 MG tablet Take 10 mg by mouth 3 (three) times daily as needed for muscle spasms.    Historical Provider, MD  cyclobenzaprine (FLEXERIL) 10 MG tablet Take 1 tablet (10 mg total) by mouth 3 (three) times daily as needed for muscle spasms. 07/18/13   Renee A Kuneff, DO  doxycycline (VIBRAMYCIN) 100 MG capsule Take 1 capsule (100 mg total) by mouth 2 (two) times  daily. 07/31/13   Gilda Creasehristopher J. Pollina, MD  HYDROcodone-acetaminophen (NORCO/VICODIN) 5-325 MG per tablet Take 1 tablet by mouth every 6 (six) hours as needed. 10/28/13   Derwood KaplanAnkit Nanavati, MD  ibuprofen (ADVIL,MOTRIN) 600 MG tablet Take 1 tablet (600 mg total) by mouth every 6 (six) hours as needed. 10/28/13   Derwood KaplanAnkit Nanavati, MD  ibuprofen (ADVIL,MOTRIN) 800 MG tablet Take 1 tablet (800 mg total) by mouth 3 (three) times daily. 07/31/13   Gilda Creasehristopher J. Pollina, MD  OxyCODONE HCl ER 30 MG T12A Take by mouth.    Historical Provider, MD   BP 141/97  Pulse 95  Temp(Src) 98.3 F (36.8 C) (Oral)  Resp 18  Ht 6\' 5"  (1.956 m)  Wt 215 lb (97.523 kg)  BMI 25.49 kg/m2  SpO2 100% Physical Exam  Nursing note and vitals reviewed. Constitutional: He appears well-developed.  HENT:  Head: Atraumatic.  Eyes: Conjunctivae are normal.  Neck: Neck supple.  Cardiovascular: Intact distal pulses.   Pulmonary/Chest: Effort normal.  Musculoskeletal:  Right ankle with ecchymoses and edema. Sensory exam normal.  Neurological: He is alert.    ED Course  Procedures (including critical care time) Labs Review Labs Reviewed - No data to display  Imaging Review Dg Ankle Complete Right  10/28/2013   CLINICAL DATA:  Worsening right lateral ankle pain.  EXAM: RIGHT ANKLE - COMPLETE 3+ VIEW  COMPARISON:  Right ankle radiographs performed 10/23/2013  FINDINGS: Previously suggested avulsion fractures at the lateral aspect of the  talus again noted, though these could be chronic in nature. There is no additional evidence of fracture. The ankle mortise is grossly unremarkable in appearance. The subtalar joint is within normal limits.  No significant soft tissue abnormalities are characterized.  IMPRESSION: Previously suggested avulsion fractures of the lateral aspect of the talus again noted, though these could be chronic in nature. No additional evidence of fracture.   Electronically Signed   By: Roanna RaiderJeffery  Chang M.D.   On:  10/28/2013 02:13     EKG Interpretation None      MDM   Final diagnoses:  Avulsion fracture of ankle    Pt with known fracture. Ambulating with ace wrap and not the Cam walker boot as advised. Repeat xray unchanged. Advised to use RICE tx and CAM walker as advised. Work note provided.   Derwood KaplanAnkit Nanavati, MD 10/28/13 [phone]

## 2013-10-28 NOTE — Progress Notes (Signed)
Patient ID: Brett Stephens, male   DOB: 06/15/1975, 38 y.o.   MRN: [id number]  PCP: Pcp Not In System  Subjective:   HPI: Patient is a 38 y.o. male here for right foot injury.  Patient reports he slid into 3rd base playing baseball on 6/11. Inverted his ankle and heard a pop, difficulty bearing weight after this. Unable to continue playing. Iced, taking ibuprofen and takes oxycodone for chronic shoulder pain. He went to ED - repeat x-rays showed possible avulsion fracture of talus. Placed in a splint with crutches but he needs a boot to be able to work.  Past Medical History  Diagnosis Date  . Injury of shoulder, left   . Sleep disorder   . Back pain     Current Outpatient Prescriptions on File Prior to Visit  Medication Sig Dispense Refill  . OxyCODONE HCl ER 30 MG T12A Take by mouth.       No current facility-administered medications on file prior to visit.    History reviewed. No pertinent past surgical history.  No Known Allergies  History   Social History  . Marital Status: Married    Spouse Name: N/A    Number of Children: N/A  . Years of Education: N/A   Occupational History  . Not on file.   Social History Main Topics  . Smoking status: Current Every Day Smoker -- 2.00 packs/day    Types: Cigarettes  . Smokeless tobacco: Not on file  . Alcohol Use: No  . Drug Use: No  . Sexual Activity: Not on file   Other Topics Concern  . Not on file   Social History Narrative  . No narrative on file    No family history on file.  BP 118/81  Pulse 86  Ht 6\' 5"  (1.956 m)  Wt 210 lb (95.255 kg)  BMI 24.90 kg/m2  Review of Systems: See HPI above.    Objective:  Physical Exam:  Gen: NAD  Right foot/ankle: Mild-mod swelling, bruising lateral foot/ankle. Mod limitation ROM all directions. TTP over ATFL, lateral talus.  No lateral malleolus, medial malleolus, navicular, base 5th, other foot/ankle tenderness. 1+ ant drawer and talar tilt.  Painful talar  tilt. Negative syndesmotic compression. Thompsons test negative. NV intact distally.    Assessment & Plan:  1. Right foot injury - radiographs show very small talus avulsion fracture.  Suspect this is new or suffered a severe ankle sprain.  Will treat conservatively.  Cam walker for next 6 weeks for talus fracture.  Elevation, icing.  Take his oxycodone with ibuprofen and capsaicin as needed.  F/u in 6 weeks.  These fractures typically heal very well with conservative treatment.  Can consider PT in future.

## 2013-10-28 NOTE — Assessment & Plan Note (Signed)
radiographs show very small talus avulsion fracture.  Suspect this is new or suffered a severe ankle sprain.  Will treat conservatively.  Cam walker for next 6 weeks for talus fracture.  Elevation, icing.  Take his oxycodone with ibuprofen and capsaicin as needed.  F/u in 6 weeks.  These fractures typically heal very well with conservative treatment.  Can consider PT in future.

## 2013-12-05 ENCOUNTER — Encounter: Payer: Self-pay | Admitting: Family Medicine

## 2013-12-05 ENCOUNTER — Ambulatory Visit (INDEPENDENT_AMBULATORY_CARE_PROVIDER_SITE_OTHER): Payer: Self-pay | Admitting: Family Medicine

## 2013-12-05 VITALS — BP 122/80 | HR 94 | Ht 77.0 in | Wt 200.0 lb

## 2013-12-05 DIAGNOSIS — S99919A Unspecified injury of unspecified ankle, initial encounter: Secondary | ICD-10-CM

## 2013-12-05 DIAGNOSIS — M722 Plantar fascial fibromatosis: Secondary | ICD-10-CM

## 2013-12-05 DIAGNOSIS — S8990XA Unspecified injury of unspecified lower leg, initial encounter: Secondary | ICD-10-CM

## 2013-12-05 DIAGNOSIS — S99929A Unspecified injury of unspecified foot, initial encounter: Secondary | ICD-10-CM

## 2013-12-05 DIAGNOSIS — S99921D Unspecified injury of right foot, subsequent encounter: Secondary | ICD-10-CM

## 2013-12-05 DIAGNOSIS — Z5189 Encounter for other specified aftercare: Secondary | ICD-10-CM

## 2013-12-05 NOTE — Patient Instructions (Signed)
You have right ankle instability. Icing, aleve or ibuprofen if needed for pain and swelling. Elevate as needed for swelling though this can take months to go completely down. Use laceup ankle brace to help with stability while you recover from this injury - wear when up and walking around for next 6 weeks then only with sports after that for 6 weeks. Start theraband strengthening exercises once a day 3 sets of 10 for 6 weeks. Consider physical therapy for strengthening and balance exercises.  You have plantar fasciitis of the left foot. Take tylenol or aleve as needed for pain  Plantar fascia stretch for 20-30 seconds (do 3 of these) in morning Lowering/raise on a step exercises 3 x 10 once or twice a day - this is very important for long term recovery. Can add heel walks, toe walks forward and backward as well Ice heel for 15 minutes as needed. Avoid flat shoes/barefoot walking as much as possible. Arch straps have been shown to help with pain. Orthotics with heel lift may be helpful. Steroid injection is a consideration for short term pain relief if you are struggling. Physical therapy is also an option. Follow up with me in 6 weeks.

## 2013-12-08 ENCOUNTER — Encounter: Payer: Self-pay | Admitting: Family Medicine

## 2013-12-08 DIAGNOSIS — M722 Plantar fascial fibromatosis: Secondary | ICD-10-CM | POA: Insufficient documentation

## 2013-12-08 NOTE — Progress Notes (Signed)
Patient ID: Brett Stephens, male   DOB: 12/27/1975, 38 y.o.   MRN: [id number]  PCP: Pcp Not In System  Subjective:   HPI: Patient is a 38 y.o. male here for right foot injury.  6/12: Patient reports he slid into 3rd base playing baseball on 6/11. Inverted his ankle and heard a pop, difficulty bearing weight after this. Unable to continue playing. Iced, taking ibuprofen and takes oxycodone for chronic shoulder pain. He went to ED - repeat x-rays showed possible avulsion fracture of talus. Placed in a splint with crutches but he needs a boot to be able to work.  7/24: Patient reports right foot has improved. Stopped wearing cam walker - had more pain with this. Ankle feels unstable though - thinks he has rolled this ankle 3 times just walking. Some days with left foot pain also on bottom of this heel.  Past Medical History  Diagnosis Date  . Injury of shoulder, left   . Sleep disorder   . Back pain     Current Outpatient Prescriptions on File Prior to Visit  Medication Sig Dispense Refill  . HYDROcodone-acetaminophen (NORCO/VICODIN) 5-325 MG per tablet Take 1 tablet by mouth every 6 (six) hours as needed.  10 tablet  0  . ibuprofen (ADVIL,MOTRIN) 600 MG tablet Take 1 tablet (600 mg total) by mouth every 6 (six) hours as needed.  30 tablet  0  . OxyCODONE HCl ER 30 MG T12A Take by mouth.       No current facility-administered medications on file prior to visit.    History reviewed. No pertinent past surgical history.  No Known Allergies  History   Social History  . Marital Status: Married    Spouse Name: N/A    Number of Children: N/A  . Years of Education: N/A   Occupational History  . Not on file.   Social History Main Topics  . Smoking status: Current Every Day Smoker -- 2.00 packs/day    Types: Cigarettes  . Smokeless tobacco: Not on file  . Alcohol Use: No  . Drug Use: No  . Sexual Activity: Not on file   Other Topics Concern  . Not on file   Social  History Narrative  . No narrative on file    No family history on file.  BP 122/80  Pulse 94  Ht 6\' 5"  (1.956 m)  Wt 200 lb (90.719 kg)  BMI 23.71 kg/m2  Review of Systems: See HPI above.    Objective:  Physical Exam:  Gen: NAD  Right foot/ankle: Minimal swelling.  No bruising, other deformity. FROM. Minimal TTP over ATFL.  No talus, lateral malleolus, medial malleolus, navicular, base 5th, other foot/ankle tenderness. 1+ ant drawer and talar tilt. Negative syndesmotic compression. Thompsons test negative. NV intact distally.  Left foot/ankle: No gross deformity, swelling, ecchymoses FROM TTP anterior calcaneus at plantar fascia insertion. Negative ant drawer and talar tilt.   Negative syndesmotic compression. Negative calcaneal squeeze. Thompsons test negative. NV intact distally.    Assessment & Plan:  1. Right foot injury - No longer tender in area of small talus avulsion fracture - clinically healed or more likely sustained sprain and this is an old injury.  Problems now are due to instability - use ASO, start strengthening exercises for next 6 weeks.  Icing, nsaids, elevation.  Consider PT.  2. Left foot plantar fasciitis - arch binders provided.  Shown home exercise program to do daily.  Icing, nsaids, tylenol.  Arch supports shown to  be beneficial.  Consider injection if not improving.  F/u in 6 weeks.

## 2013-12-08 NOTE — Assessment & Plan Note (Signed)
No longer tender in area of small talus avulsion fracture - clinically healed or more likely sustained sprain and this is an old injury.  Problems now are due to instability - use ASO, start strengthening exercises for next 6 weeks.  Icing, nsaids, elevation.  Consider PT.

## 2013-12-08 NOTE — Assessment & Plan Note (Signed)
arch binders provided.  Shown home exercise program to do daily.  Icing, nsaids, tylenol.  Arch supports shown to be beneficial.  Consider injection if not improving.  F/u in 6 weeks.

## 2014-01-16 ENCOUNTER — Ambulatory Visit: Payer: Self-pay | Admitting: Family Medicine

## 2014-01-23 ENCOUNTER — Emergency Department (HOSPITAL_BASED_OUTPATIENT_CLINIC_OR_DEPARTMENT_OTHER): Payer: Self-pay

## 2014-01-23 ENCOUNTER — Encounter (HOSPITAL_BASED_OUTPATIENT_CLINIC_OR_DEPARTMENT_OTHER): Payer: Self-pay | Admitting: Emergency Medicine

## 2014-01-23 ENCOUNTER — Emergency Department (HOSPITAL_BASED_OUTPATIENT_CLINIC_OR_DEPARTMENT_OTHER)
Admission: EM | Admit: 2014-01-23 | Discharge: 2014-01-23 | Disposition: A | Discharge status: Home / Self Care | Payer: Self-pay | Attending: Emergency Medicine | Admitting: Emergency Medicine

## 2014-01-23 DIAGNOSIS — R059 Cough, unspecified: Secondary | ICD-10-CM | POA: Insufficient documentation

## 2014-01-23 DIAGNOSIS — J40 Bronchitis, not specified as acute or chronic: Secondary | ICD-10-CM | POA: Insufficient documentation

## 2014-01-23 DIAGNOSIS — Z87828 Personal history of other (healed) physical injury and trauma: Secondary | ICD-10-CM | POA: Insufficient documentation

## 2014-01-23 DIAGNOSIS — F17200 Nicotine dependence, unspecified, uncomplicated: Secondary | ICD-10-CM | POA: Insufficient documentation

## 2014-01-23 DIAGNOSIS — R05 Cough: Secondary | ICD-10-CM | POA: Insufficient documentation

## 2014-01-23 DIAGNOSIS — R079 Chest pain, unspecified: Secondary | ICD-10-CM | POA: Insufficient documentation

## 2014-01-23 DIAGNOSIS — Z79899 Other long term (current) drug therapy: Secondary | ICD-10-CM | POA: Insufficient documentation

## 2014-01-23 MED ORDER — PREDNISONE 10 MG PO TABS: ORAL_TABLET | ORAL | Status: AC

## 2014-01-23 MED ORDER — AZITHROMYCIN 250 MG PO TABS: 250.0000 mg | ORAL_TABLET | Freq: Every day | ORAL | Status: AC

## 2014-01-23 MED ORDER — ALBUTEROL SULFATE HFA 108 (90 BASE) MCG/ACT IN AERS: 1.0000 | INHALATION_SPRAY | Freq: Four times a day (QID) | RESPIRATORY_TRACT | Status: AC | PRN

## 2014-01-23 NOTE — ED Provider Notes (Signed)
CSN: [id number]     Arrival date & time 01/23/14  2057 History   First MD Initiated Contact with Patient 01/23/14 2107     Chief Complaint  Patient presents with  . Cough     (Consider location/radiation/quality/duration/timing/severity/associated sxs/prior Treatment) Patient is a 38 y.o. male presenting with cough. The history is provided by the patient. No language interpreter was used.  Cough Cough characteristics:  Productive Sputum characteristics:  Nondescript Severity:  Moderate Onset quality:  Gradual Duration:  1 week Timing:  Constant Progression:  Worsening Chronicity:  New Smoker: yes   Context: upper respiratory infection   Relieved by:  Nothing Worsened by:  Nothing tried Associated symptoms: chest pain, shortness of breath and sinus congestion   Associated symptoms: no fever     Past Medical History  Diagnosis Date  . Injury of shoulder, left   . Sleep disorder   . Back pain    History reviewed. No pertinent past surgical history. No family history on file. History  Substance Use Topics  . Smoking status: Current Every Day Smoker -- 2.00 packs/day    Types: Cigarettes  . Smokeless tobacco: Not on file  . Alcohol Use: No    Review of Systems  Constitutional: Negative for fever.  Respiratory: Positive for cough and shortness of breath.   Cardiovascular: Positive for chest pain.  All other systems reviewed and are negative.     Allergies  Review of patient's allergies indicates no known allergies.  Home Medications   Prior to Admission medications   Medication Sig Start Date End Date Taking? Authorizing Provider  HYDROcodone-acetaminophen (NORCO/VICODIN) 5-325 MG per tablet Take 1 tablet by mouth every 6 (six) hours as needed. 10/28/13   Derwood Kaplan, MD  ibuprofen (ADVIL,MOTRIN) 600 MG tablet Take 1 tablet (600 mg total) by mouth every 6 (six) hours as needed. 10/28/13   Derwood Kaplan, MD  OxyCODONE HCl ER 30 MG T12A Take by mouth.     Historical Provider, MD   BP 158/93  Pulse 110  Temp(Src) 98.4 F (36.9 C) (Oral)  Resp 20  Ht  (1.956 m)  Wt 230 lb (104.327 kg)  BMI 27.27 kg/m2  SpO2 97% Physical Exam  Nursing note and vitals reviewed. Constitutional: He is oriented to person, place, and time. He appears well-developed and well-nourished.  HENT:  Head: Normocephalic and atraumatic.  Eyes: Conjunctivae and EOM are normal. Pupils are equal, round, and reactive to light.  Neck: Normal range of motion. Neck supple.  Cardiovascular: Normal rate and normal heart sounds.   Pulmonary/Chest: He exhibits tenderness.  Abdominal: Soft.  Musculoskeletal: Normal range of motion.  Neurological: He is alert and oriented to person, place, and time. He has normal reflexes.  Skin: Skin is warm.  Psychiatric: He has a normal mood and affect.    ED Course  Procedures (including critical care time) Labs Review Labs Reviewed - No data to display  Imaging Review No results found.   EKG Interpretation None      MDM   Final diagnoses:  Bronchitis   zithromax Albuterol  Prednisone taper    Elson Areas, PA-C 01/23/14 2137

## 2014-01-23 NOTE — ED Notes (Signed)
Cough with bloody sputum for a few days. States for the past week he has had a cold.

## 2014-01-23 NOTE — ED Provider Notes (Signed)
Medical screening examination/treatment/procedure(s) were performed by non-physician practitioner and as supervising physician I was immediately available for consultation/collaboration.    Linwood Dibbles, MD 01/23/14 [phone]

## 2014-01-23 NOTE — ED Notes (Signed)
PA at bedside.

## 2014-01-23 NOTE — Discharge Instructions (Signed)
How to Use an Inhaler °Using your inhaler correctly is very important. Good technique will make sure that the medicine reaches your lungs.  °HOW TO USE AN INHALER: °1. Take the cap off the inhaler. °2. If this is the first time using your inhaler, you need to prime it. Shake the inhaler for 5 seconds. Release four puffs into the air, away from your face. Ask your doctor for help if you have questions. °3. Shake the inhaler for 5 seconds. °4. Turn the inhaler so the bottle is above the mouthpiece. °5. Put your pointer finger on top of the bottle. Your thumb holds the bottom of the inhaler. °6. Open your mouth. °7. Either hold the inhaler away from your mouth (the width of 2 fingers) or place your lips tightly around the mouthpiece. Ask your doctor which way to use your inhaler. °8. Breathe out as much air as possible. °9. Breathe in and push down on the bottle 1 time to release the medicine. You will feel the medicine go in your mouth and throat. °10. Continue to take a deep breath in very slowly. Try to fill your lungs. °11. After you have breathed in completely, hold your breath for 10 seconds. This will help the medicine to settle in your lungs. If you cannot hold your breath for 10 seconds, hold it for as long as you can before you breathe out. °12. Breathe out slowly, through pursed lips. Whistling is an example of pursed lips. °13. If your doctor has told you to take more than 1 puff, wait at least 15-30 seconds between puffs. This will help you get the best results from your medicine. Do not use the inhaler more than your doctor tells you to. °14. Put the cap back on the inhaler. °15. Follow the directions from your doctor or from the inhaler package about cleaning the inhaler. °If you use more than one inhaler, ask your doctor which inhalers to use and what order to use them in. Ask your doctor to help you figure out when you will need to refill your inhaler.  °If you use a steroid inhaler, always rinse your  mouth with water after your last puff, gargle and spit out the water. Do not swallow the water. °GET HELP IF: °· The inhaler medicine only partially helps to stop wheezing or shortness of breath. °· You are having trouble using your inhaler. °· You have some increase in thick spit (phlegm). °GET HELP RIGHT AWAY IF: °· The inhaler medicine does not help your wheezing or shortness of breath or you have tightness in your chest. °· You have dizziness, headaches, or fast heart rate. °· You have chills, fever, or night sweats. °· You have a large increase of thick spit, or your thick spit is bloody. °MAKE SURE YOU:  °· Understand these instructions. °· Will watch your condition. °· Will get help right away if you are not doing well or get worse. °Document Released: 02/08/2008 Document Revised: 02/19/2013 Document Reviewed: 11/28/2012 °[employer]® Patient Information ©2015 [employer]. This information is not intended to replace advice given to you by your health care provider. Make sure you discuss any questions you have with your health care provider. °Acute Bronchitis °Bronchitis is inflammation of the airways that extend from the windpipe into the lungs (bronchi). The inflammation often causes mucus to develop. This leads to a cough, which is the most common symptom of bronchitis.  °In acute bronchitis, the condition usually develops suddenly and goes away over   time, usually in a couple weeks. Smoking, allergies, and asthma can make bronchitis worse. Repeated episodes of bronchitis may cause further lung problems.  °CAUSES °Acute bronchitis is most often caused by the same virus that causes a cold. The virus can spread from person to person (contagious) through coughing, sneezing, and touching contaminated objects. °SIGNS AND SYMPTOMS  °· Cough.   °· Fever.   °· Coughing up mucus.   °· Body aches.   °· Chest congestion.   °· Chills.   °· Shortness of breath.   °· Sore throat.   °DIAGNOSIS  °Acute bronchitis is usually  diagnosed through a physical exam. Your health care provider will also ask you questions about your medical history. Tests, such as chest X-rays, are sometimes done to rule out other conditions.  °TREATMENT  °Acute bronchitis usually goes away in a couple weeks. Oftentimes, no medical treatment is necessary. Medicines are sometimes given for relief of fever or cough. Antibiotic medicines are usually not needed but may be prescribed in certain situations. In some cases, an inhaler may be recommended to help reduce shortness of breath and control the cough. A cool mist vaporizer may also be used to help thin bronchial secretions and make it easier to clear the chest.  °HOME CARE INSTRUCTIONS °· Get plenty of rest.   °· Drink enough fluids to keep your urine clear or pale yellow (unless you have a medical condition that requires fluid restriction). Increasing fluids may help thin your respiratory secretions (sputum) and reduce chest congestion, and it will prevent dehydration.   °· Take medicines only as directed by your health care provider. °· If you were prescribed an antibiotic medicine, finish it all even if you start to feel better. °· Avoid smoking and secondhand smoke. Exposure to cigarette smoke or irritating chemicals will make bronchitis worse. If you are a smoker, consider using nicotine gum or skin patches to help control withdrawal symptoms. Quitting smoking will help your lungs heal faster.   °· Reduce the chances of another bout of acute bronchitis by washing your hands frequently, avoiding people with cold symptoms, and trying not to touch your hands to your mouth, nose, or eyes.   °· Keep all follow-up visits as directed by your health care provider.   °SEEK MEDICAL CARE IF: °Your symptoms do not improve after 1 week of treatment.  °SEEK IMMEDIATE MEDICAL CARE IF: °· You develop an increased fever or chills.   °· You have chest pain.   °· You have severe shortness of breath. °· You have bloody sputum.    °· You develop dehydration. °· You faint or repeatedly feel like you are going to pass out. °· You develop repeated vomiting. °· You develop a severe headache. °MAKE SURE YOU:  °· Understand these instructions. °· Will watch your condition. °· Will get help right away if you are not doing well or get worse. °Document Released: 06/08/2004 Document Revised: 09/15/2013 Document Reviewed: 10/22/2012 °ExitCare® Patient Information ©2015 [employer]. This information is not intended to replace advice given to you by your health care provider. Make sure you discuss any questions you have with your health care provider. ° °

## 2014-08-19 ENCOUNTER — Emergency Department (HOSPITAL_BASED_OUTPATIENT_CLINIC_OR_DEPARTMENT_OTHER)
Admission: EM | Admit: 2014-08-19 | Discharge: 2014-08-19 | Disposition: A | Discharge status: Home / Self Care | Payer: Self-pay

## 2015-06-30 IMAGING — CR DG ANKLE COMPLETE 3+V*R*
3 series · 3 of 3 positions shown · non-contrast
Comparison: None.

CLINICAL DATA: Right ankle pain following injury

EXAM:
RIGHT ANKLE - COMPLETE 3+ VIEW

[t ankle joint ap right]
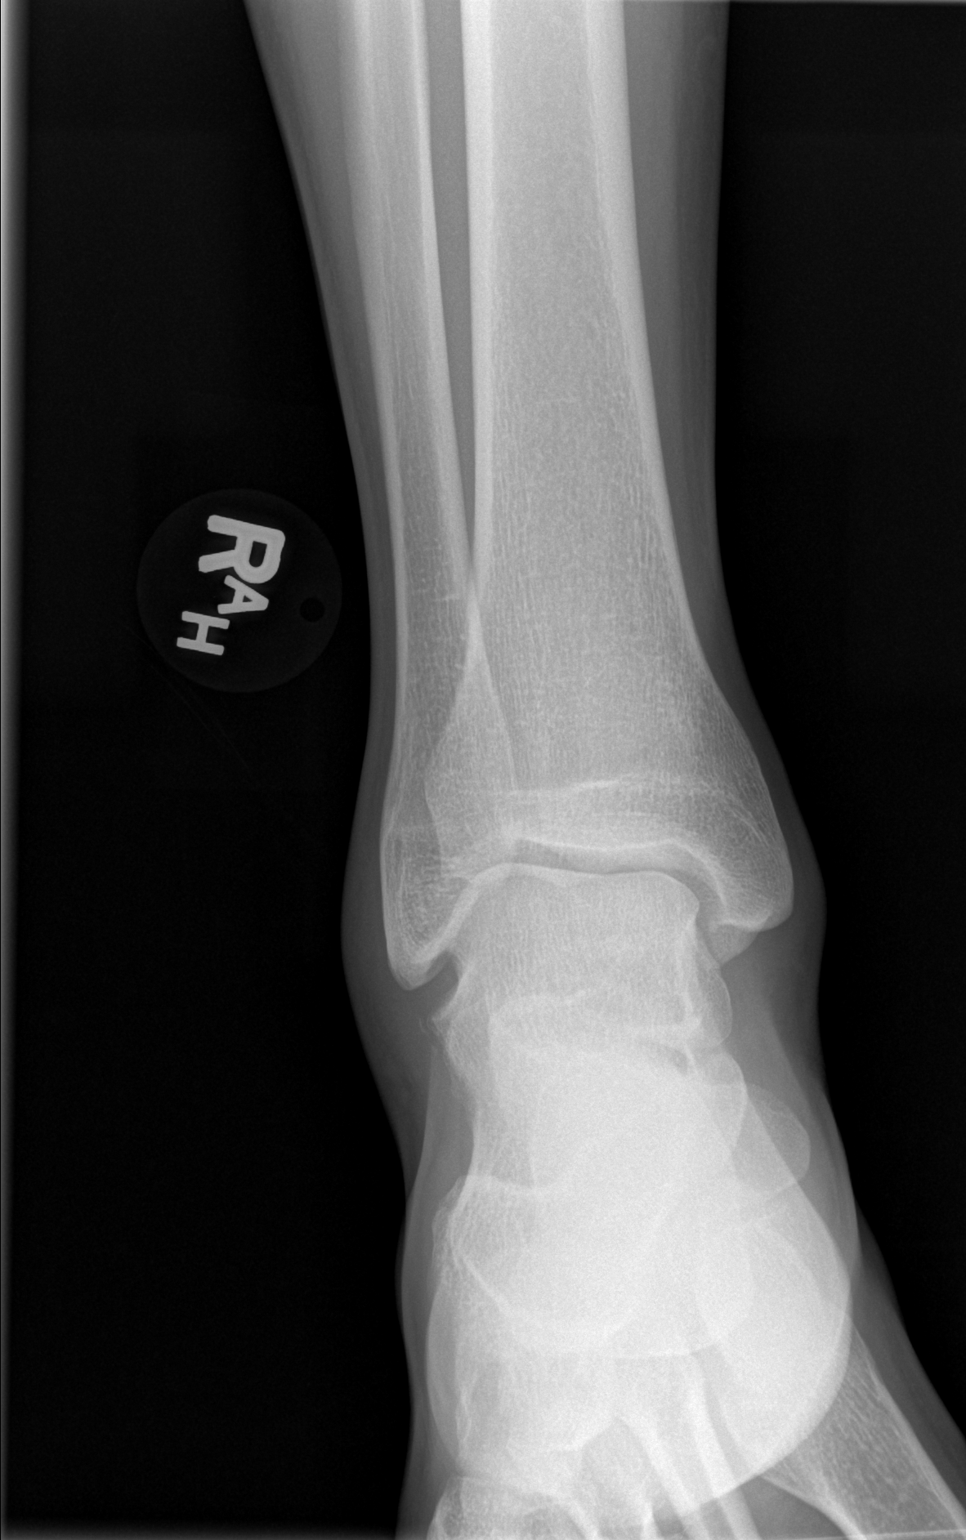

[t ankle joint oblique right]
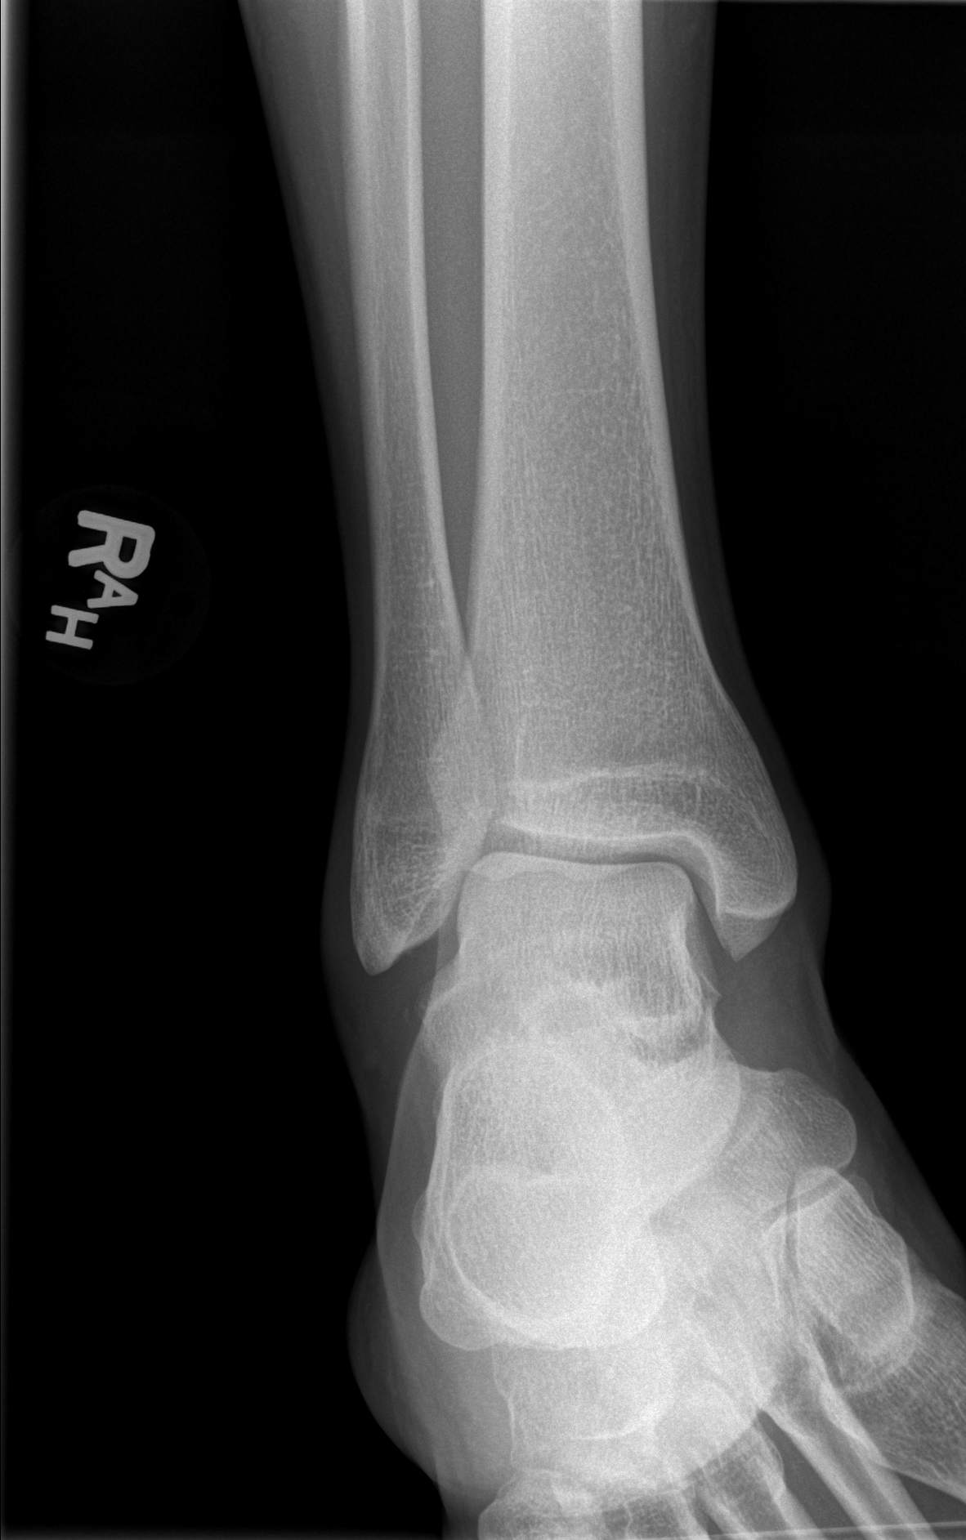

[t ankle joint lat right]
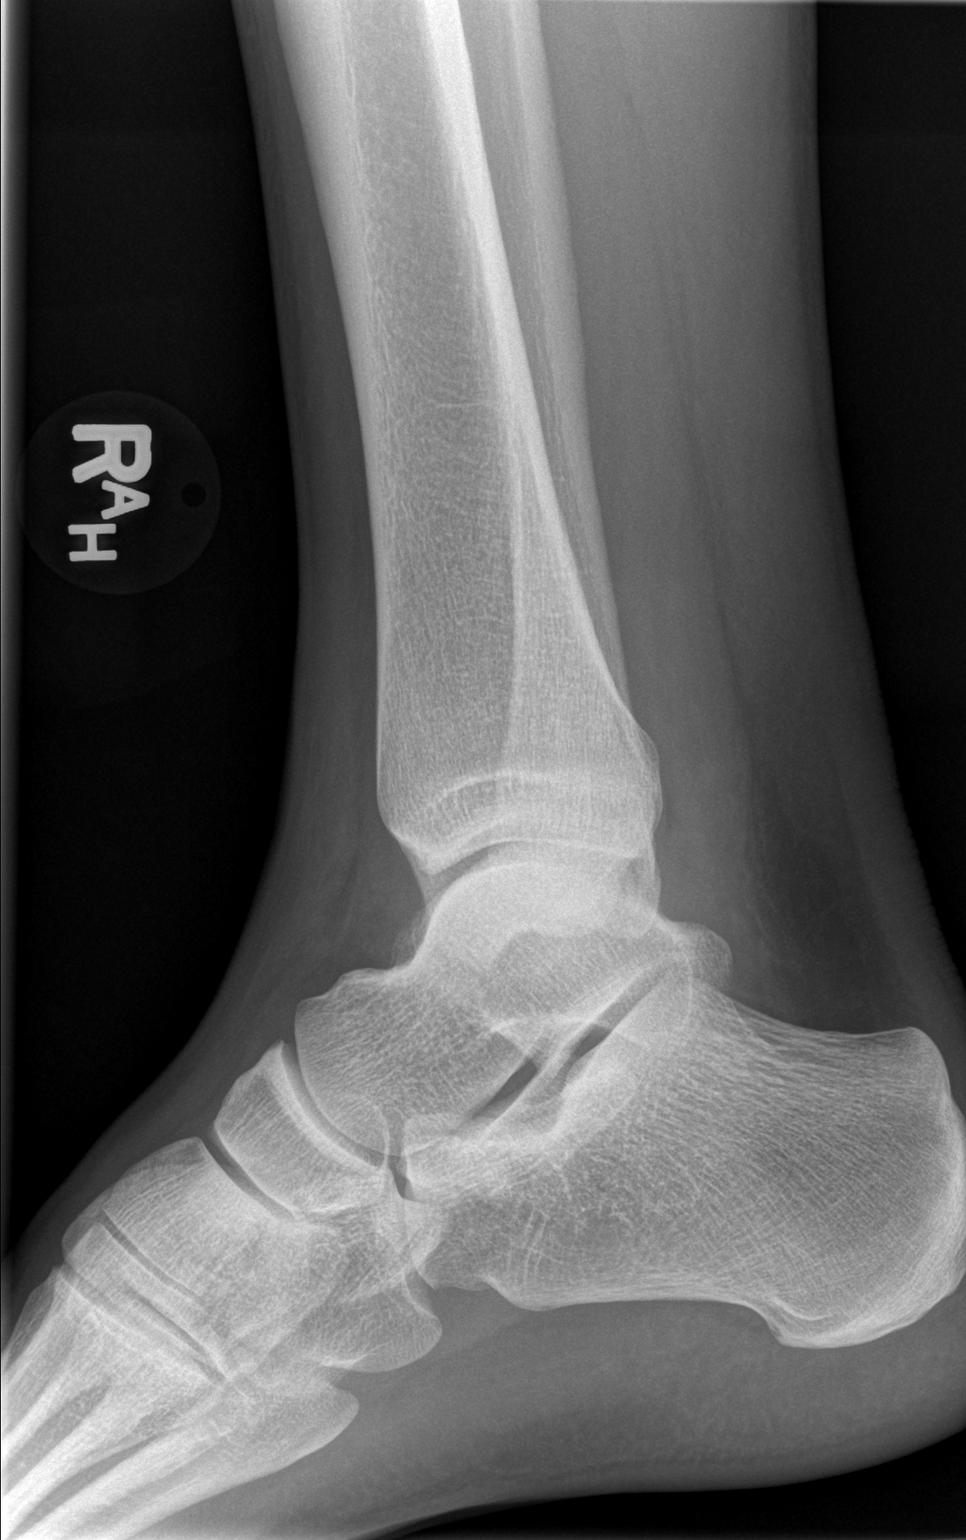

[3 of 3 positions shown; findings below may reference images not displayed]

FINDINGS: A tiny bony densities are noted adjacent to the lateral aspect of
the talus likely representing small avulsion fractures. Associated
soft tissue swelling is seen. No other focal abnormality is noted.
IMPRESSION: Small avulsion fractures from the lateral talus.

## 2015-07-05 IMAGING — CR DG ANKLE COMPLETE 3+V*R*
3 series · 3 of 3 positions shown · non-contrast
Comparison: Right ankle radiographs performed 10/23/2013

CLINICAL DATA: Worsening right lateral ankle pain.

EXAM:
RIGHT ANKLE - COMPLETE 3+ VIEW

[t ankle joint ap right]
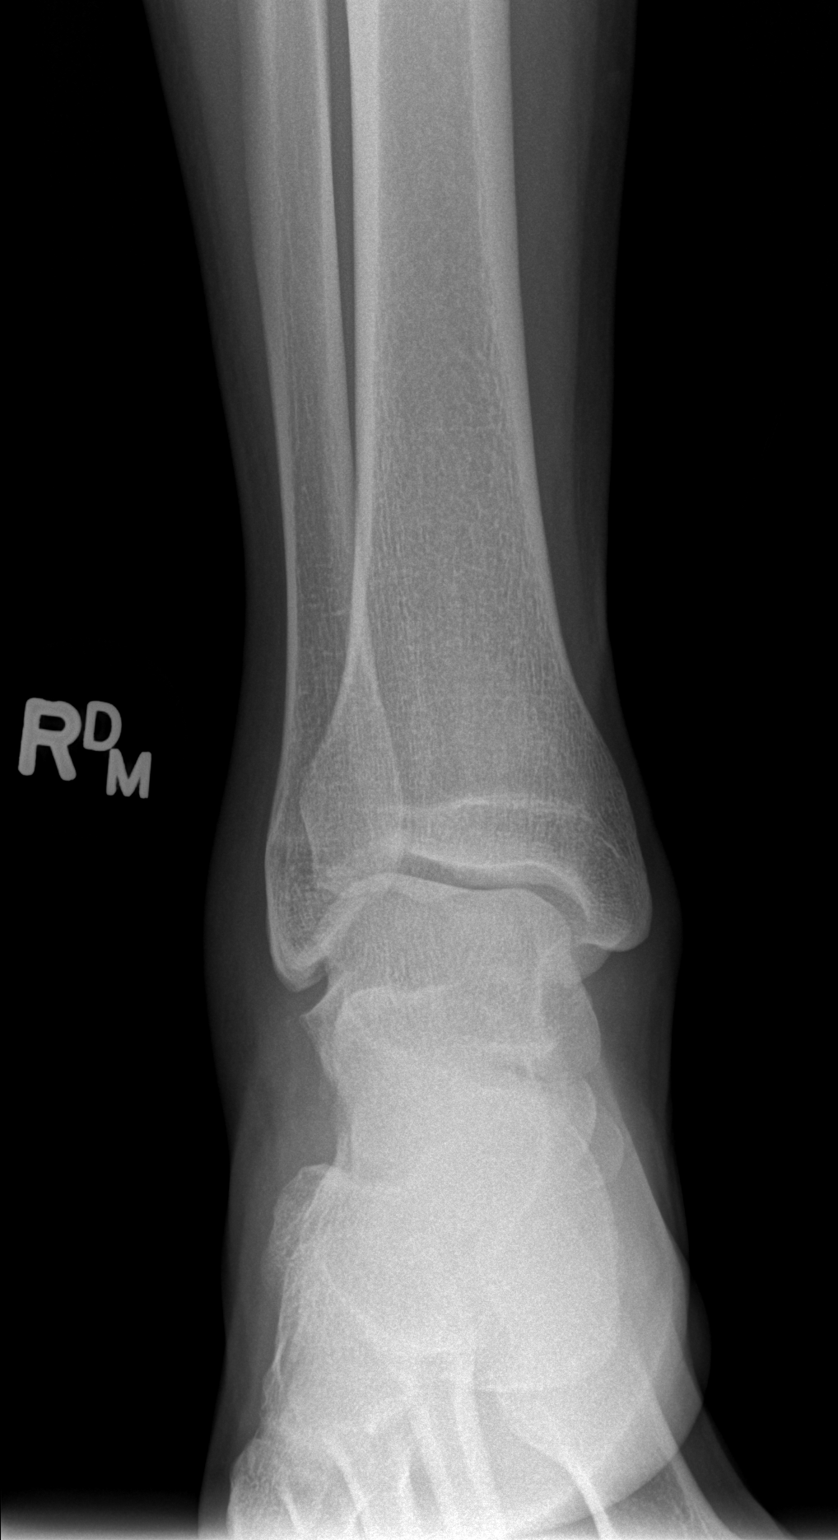

[t ankle joint oblique right]
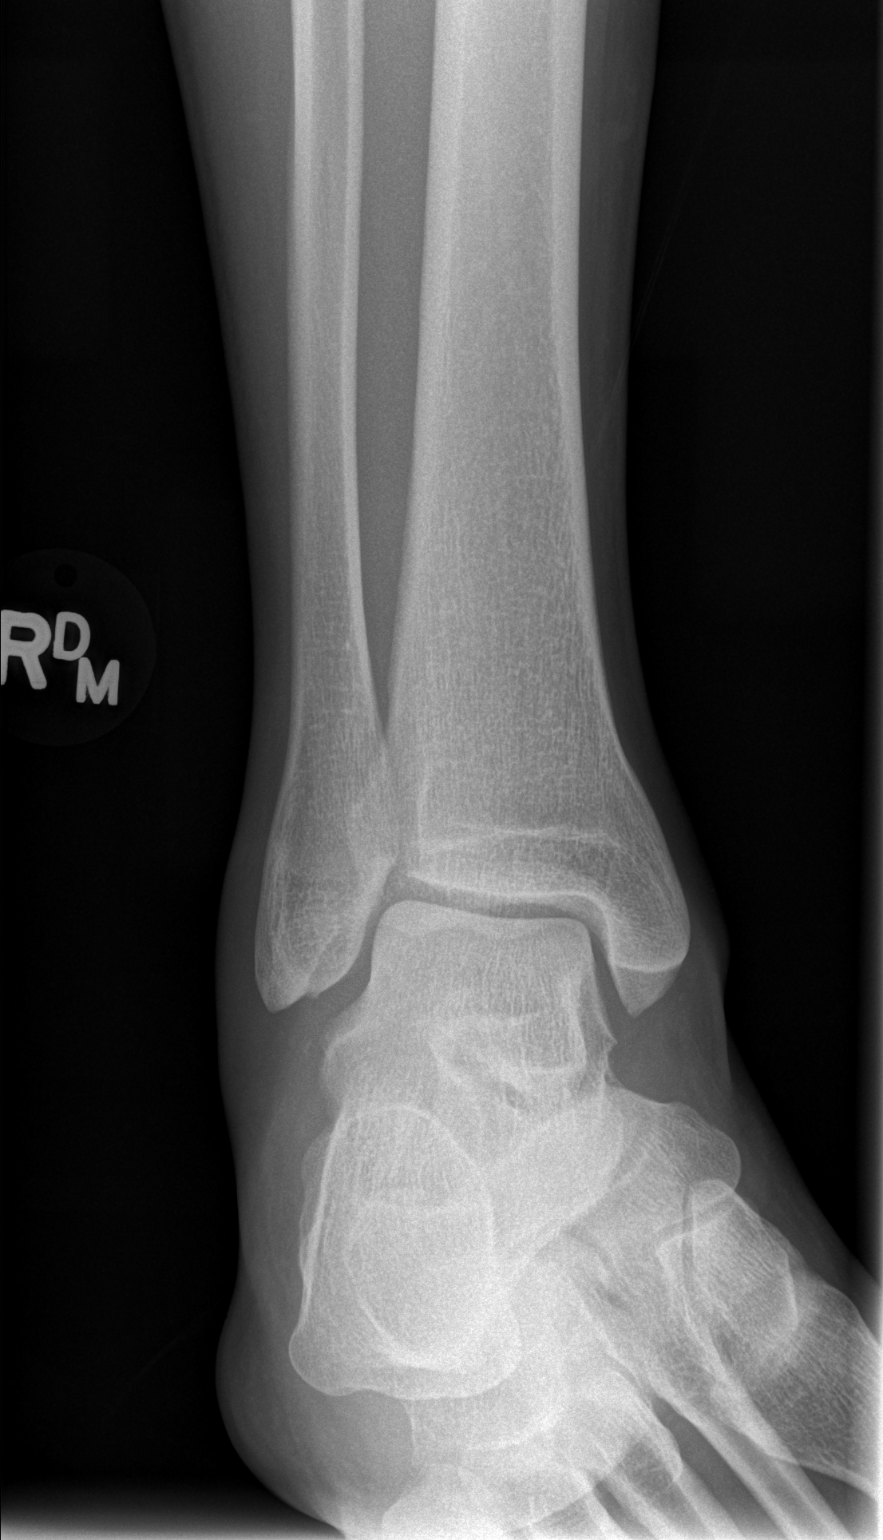

[t ankle joint lat right]
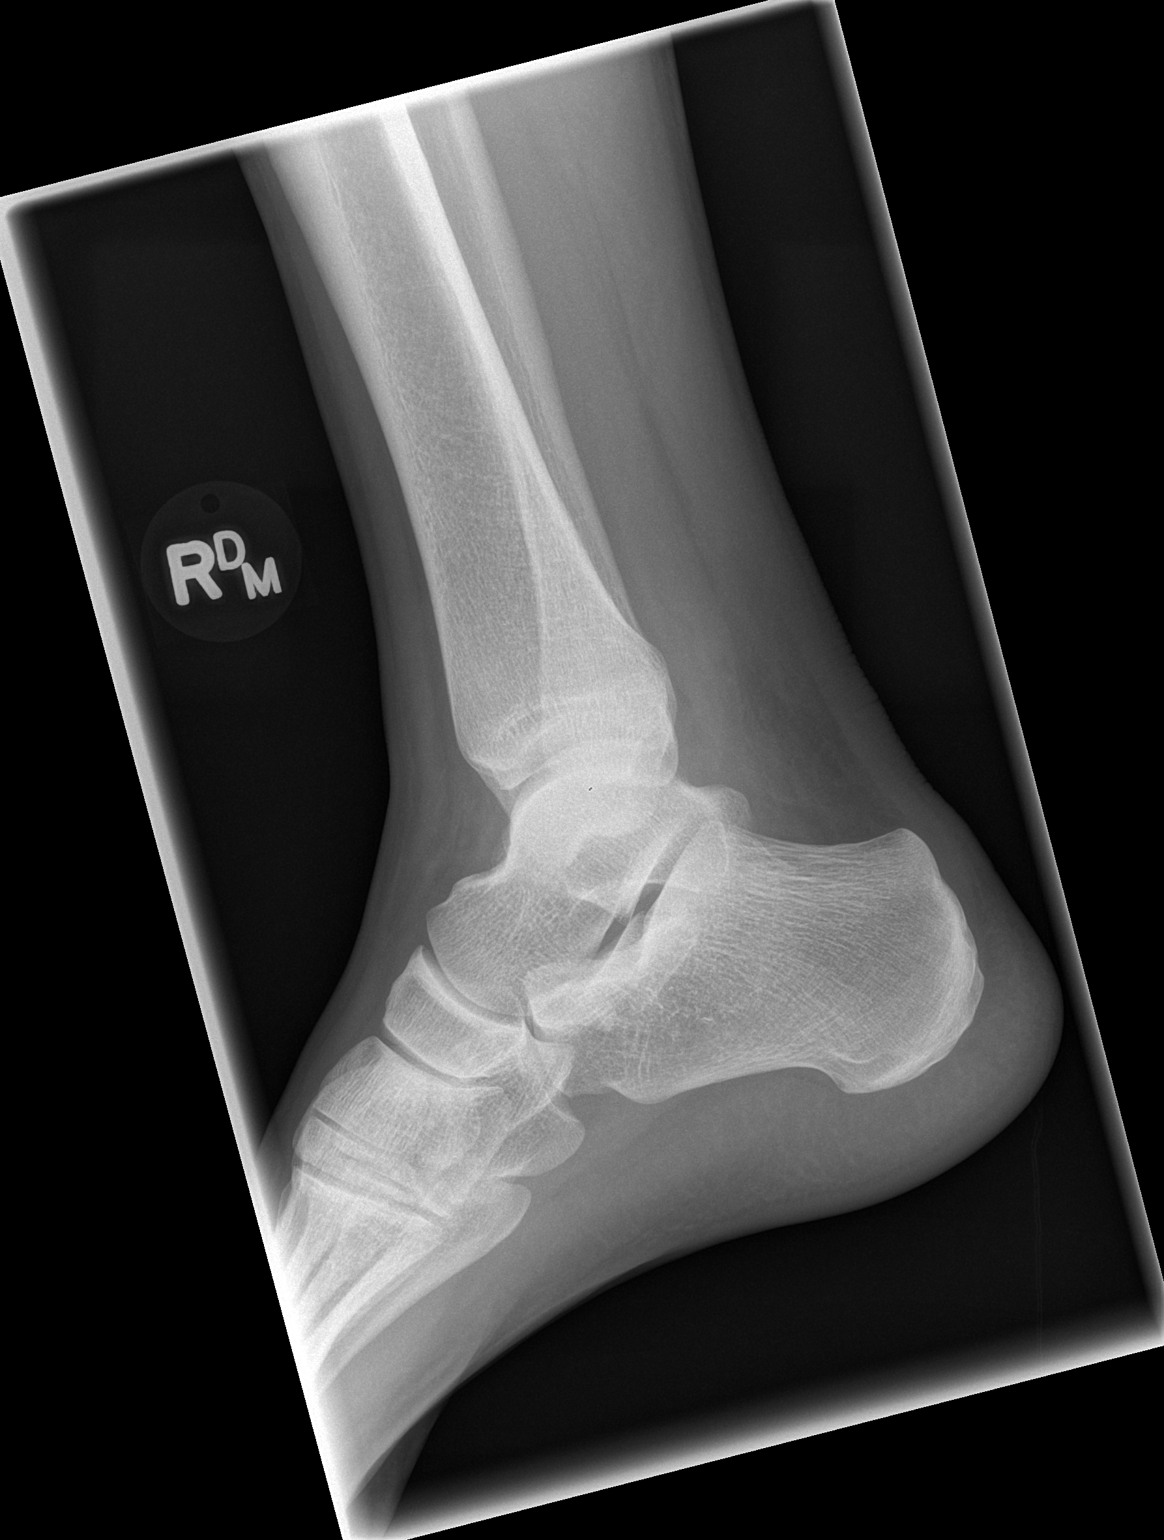

[3 of 3 positions shown; findings below may reference images not displayed]

FINDINGS: Previously suggested avulsion fractures at the lateral aspect of the
talus again noted, though these could be chronic in nature. There is
no additional evidence of fracture. The ankle mortise is grossly
unremarkable in appearance. The subtalar joint is within normal
limits.

No significant soft tissue abnormalities are characterized.
IMPRESSION: Previously suggested avulsion fractures of the lateral aspect of the
talus again noted, though these could be chronic in nature. No
additional evidence of fracture.

## 2016-08-31 ENCOUNTER — Emergency Department (HOSPITAL_BASED_OUTPATIENT_CLINIC_OR_DEPARTMENT_OTHER): Payer: Self-pay

## 2016-08-31 ENCOUNTER — Emergency Department (HOSPITAL_BASED_OUTPATIENT_CLINIC_OR_DEPARTMENT_OTHER)
Admission: EM | Admit: 2016-08-31 | Discharge: 2016-08-31 | Discharge status: Against Medical Advice | Payer: Self-pay | Attending: Emergency Medicine | Admitting: Emergency Medicine

## 2016-08-31 ENCOUNTER — Encounter (HOSPITAL_BASED_OUTPATIENT_CLINIC_OR_DEPARTMENT_OTHER): Payer: Self-pay | Admitting: *Deleted

## 2016-08-31 DIAGNOSIS — Y999 Unspecified external cause status: Secondary | ICD-10-CM | POA: Insufficient documentation

## 2016-08-31 DIAGNOSIS — Y939 Activity, unspecified: Secondary | ICD-10-CM | POA: Insufficient documentation

## 2016-08-31 DIAGNOSIS — S51031A Puncture wound without foreign body of right elbow, initial encounter: Secondary | ICD-10-CM | POA: Insufficient documentation

## 2016-08-31 DIAGNOSIS — F17210 Nicotine dependence, cigarettes, uncomplicated: Secondary | ICD-10-CM | POA: Insufficient documentation

## 2016-08-31 DIAGNOSIS — W540XXA Bitten by dog, initial encounter: Secondary | ICD-10-CM | POA: Insufficient documentation

## 2016-08-31 DIAGNOSIS — Y92009 Unspecified place in unspecified non-institutional (private) residence as the place of occurrence of the external cause: Secondary | ICD-10-CM | POA: Insufficient documentation

## 2016-08-31 DIAGNOSIS — S71152A Open bite, left thigh, initial encounter: Secondary | ICD-10-CM | POA: Insufficient documentation

## 2016-08-31 MED ORDER — HYDROMORPHONE HCL 1 MG/ML IJ SOLN: 1.0000 mg | Freq: Once | INTRAMUSCULAR | Status: DC

## 2016-08-31 MED ORDER — HYDROMORPHONE HCL 1 MG/ML IJ SOLN: 2.0000 mg | Freq: Once | INTRAMUSCULAR | Status: DC

## 2016-08-31 MED ORDER — RABIES VACCINE, PCEC IM SUSR: 1.0000 mL | Freq: Once | INTRAMUSCULAR | Status: DC

## 2016-08-31 MED ORDER — AMOXICILLIN-POT CLAVULANATE 875-125 MG PO TABS: 1.0000 | ORAL_TABLET | Freq: Once | ORAL | Status: DC

## 2016-08-31 MED ORDER — SODIUM CHLORIDE 0.9 % IV SOLN: 3.0000 g | Freq: Once | INTRAVENOUS | Status: DC

## 2016-08-31 MED ORDER — SODIUM CHLORIDE 0.9 % IV BOLUS (SEPSIS): 500.0000 mL | Freq: Once | INTRAVENOUS | Status: DC

## 2016-08-31 MED ORDER — AMOXICILLIN-POT CLAVULANATE 875-125 MG PO TABS: 1.0000 | ORAL_TABLET | Freq: Two times a day (BID) | ORAL | 0 refills | Status: AC

## 2016-08-31 MED ORDER — SODIUM CHLORIDE 0.9 % IV SOLN: INTRAVENOUS | Status: DC

## 2016-08-31 MED ORDER — ONDANSETRON 4 MG PO TBDP: 4.0000 mg | ORAL_TABLET | Freq: Once | ORAL | Status: DC

## 2016-08-31 MED ORDER — TETANUS-DIPHTH-ACELL PERTUSSIS 5-2.5-18.5 LF-MCG/0.5 IM SUSP: 0.5000 mL | Freq: Once | INTRAMUSCULAR | Status: DC

## 2016-08-31 MED ORDER — RABIES IMMUNE GLOBULIN 150 UNIT/ML IM INJ: 20.0000 [IU]/kg | INJECTION | Freq: Once | INTRAMUSCULAR | Status: DC

## 2016-08-31 MED ORDER — ONDANSETRON HCL 4 MG/2ML IJ SOLN: 4.0000 mg | Freq: Once | INTRAMUSCULAR | Status: DC

## 2016-08-31 NOTE — ED Triage Notes (Addendum)
Attacked by 2 pit bulls. Punctures to his left upper leg and to his right elbow. Bleeding controlled. He was knocked to the ground and has a hematoma and abrasion to his left forehead. No LOC. Animal control was not contacted. The dogs were roaming the street. Unknown to this pt.

## 2016-08-31 NOTE — Discharge Instructions (Signed)
Plan discussed with patient. Patient insists on signing out AMA. He realizes a significant risk to the dog bites both due to infection perhaps bone injury and as well as rabies. Patient notified he can return at any time. He did state that if we gave him prescription for Augmentin he would get it filled and take it at home. Does not 1 rabies vaccinations. Does not want the wounds cleaned.

## 2016-08-31 NOTE — ED Provider Notes (Signed)
[facility]   CSN: [id number] Arrival date & time: 08/31/16  1451     History   Chief Complaint Chief Complaint  Patient presents with  . Animal Bite    HPI Brett Stephens is a 41 y.o. male.  Patient status post bite wounds from 2 stray pit bull's. They did have collars on. Occurred at his neighborhood of residence. Patient's tetanus is not up-to-date. Injuries are to the left thigh area right elbow and questionable right knee. The animals are unknown to the patient. Does not know who they belong to.      Past Medical History:  Diagnosis Date  . Back pain   . Injury of shoulder, left   . Sleep disorder     Patient Active Problem List   Diagnosis Date Noted  . Plantar fasciitis, left 12/08/2013  . Right foot injury 10/28/2013    History reviewed. No pertinent surgical history.     Home Medications    Prior to Admission medications   Medication Sig Start Date End Date Taking? Authorizing Provider  albuterol (PROVENTIL HFA;VENTOLIN HFA) 108 (90 BASE) MCG/ACT inhaler Inhale 1-2 puffs into the lungs every 6 (six) hours as needed for wheezing or shortness of breath. 01/23/14   Elson Areas, PA-C  amoxicillin-clavulanate (AUGMENTIN) 875-125 MG tablet Take 1 tablet by mouth 2 (two) times daily. 08/31/16   Vanetta Mulders, MD  azithromycin (ZITHROMAX) 250 MG tablet Take 1 tablet (250 mg total) by mouth daily. Take first 2 tablets together, then 1 every day until finished. 01/23/14   Elson Areas, PA-C  HYDROcodone-acetaminophen (NORCO/VICODIN) 5-325 MG per tablet Take 1 tablet by mouth every 6 (six) hours as needed. 10/28/13   Derwood Kaplan, MD  ibuprofen (ADVIL,MOTRIN) 600 MG tablet Take 1 tablet (600 mg total) by mouth every 6 (six) hours as needed. 10/28/13   Derwood Kaplan, MD  OxyCODONE HCl ER 30 MG T12A Take by mouth.    Historical Provider, MD  predniSONE (DELTASONE) 10 MG tablet 6,5,4,3,2,1 taper 01/23/14   Elson Areas, PA-C     Family History No family history on file.  Social History Social History  Substance Use Topics  . Smoking status: Current Every Day Smoker    Packs/day: 2.00    Types: Cigarettes  . Smokeless tobacco: Never Used  . Alcohol use No     Allergies   Patient has no known allergies.   Review of Systems Review of Systems  Constitutional: Negative for fever.  HENT: Negative for congestion.   Eyes: Negative for redness.  Respiratory: Negative for shortness of breath.   Cardiovascular: Negative for chest pain.  Gastrointestinal: Negative for abdominal pain.  Genitourinary: Negative for hematuria.  Musculoskeletal: Negative for back pain and neck pain.  Skin: Positive for wound.  Neurological: Negative for weakness and numbness.  Hematological: Does not bruise/bleed easily.  Psychiatric/Behavioral: Negative for confusion.     Physical Exam Updated Vital Signs BP 123/83 (BP Location: Left Arm)   Pulse 81   Temp 98.3 F (36.8 C) (Oral)   Resp 18   Ht  (1.956 m)   Wt 90.7 kg   SpO2 99%   BMI 23.72 kg/m   Physical Exam  Constitutional: He is oriented to person, place, and time. He appears well-developed and well-nourished. No distress.  HENT:  Head: Normocephalic and atraumatic.  Mouth/Throat: Oropharynx is clear and moist.  Eyes: Conjunctivae and EOM are normal. Pupils are equal, round, and reactive to light.  Neck: Normal range of motion. Neck supple.  Cardiovascular: Normal rate and regular rhythm.   Pulmonary/Chest: Effort normal and breath sounds normal. No respiratory distress.  Abdominal: Soft. Bowel sounds are normal. There is no tenderness.  Musculoskeletal: Normal range of motion.  Except for pain with range of motion of the right elbow. Puncture wounds around the area of the right elbow. No lacerations. Radial pulse distally is 2+. Questionable puncture wounds around the right knee but may just be injury from falling during the dog attack. Clear dog  bite to left anterior medial thigh area. With a 4 cm laceration with muscle exposed. Other puncture wounds in the area. Dorsalis pedis pulse distally to the left and right leg is plus.  Neurological: He is alert and oriented to person, place, and time. No cranial nerve deficit or sensory deficit. He exhibits normal muscle tone. Coordination normal.  Skin: Skin is warm.  Nursing note and vitals reviewed.    ED Treatments / Results  Labs (all labs ordered are listed, but only abnormal results are displayed) Labs Reviewed - No data to display  EKG  EKG Interpretation None       Radiology No results found.  Procedures Procedures (including critical care time)  Medications Ordered in ED Medications  Tdap (BOOSTRIX) injection 0.5 mL (not administered)  HYDROmorphone (DILAUDID) injection 2 mg (not administered)  amoxicillin-clavulanate (AUGMENTIN) 875-125 MG per tablet 1 tablet (not administered)  rabies vaccine (RABAVERT) injection 1 mL (not administered)  rabies immune globulin (HYPERAB) injection 1,800 Units (not administered)  ondansetron (ZOFRAN-ODT) disintegrating tablet 4 mg (not administered)     Initial Impression / Assessment and Plan / ED Course  I have reviewed the triage vital signs and the nursing notes.  Pertinent labs & imaging results that were available during my care of the patient were reviewed by me and considered in my medical decision making (see chart for details).     Patient with significant dog bite to left thigh. With 4 cm of skin laceration with some muscle exposure. Surrounding puncture bite wounds. But not on the posterior part of the thigh. Everything on the anterior medial part of thigh. The right knee also with questionable abrasions versus puncture wounds. And the right elbow with definite puncture wounds from dog bite no significant laceration.  Distally pulses are intact. Patient has some pain of range of motion of the right  elbow.  Patient's tetanus is not up-to-date.  Patient states that the animals were straight ahead collars on. Therefore does require rabies immunization as well as immunoglobulin. Police notified by [place] out of concern for dogs wondering in the [place] neighborhood area with but could also injure other people.   Patient was to be receive of Augmentin by mouth here x-rays of the left thigh right knee right elbow. Plan was to the loosely close the wound to the left thigh.  Patient also had IM pain medicine and oral antinausea medicine ordered.  Patient is refusing x-rays refreezing wound care once sign out AMA. Patient stated that I gave him a prescription for Augmentin he would take that.  Patient did report to [place] that does have a history of IV drug abuse. Final Clinical Impressions(s) / ED Diagnoses   Final diagnoses:  Dog bite, initial encounter    New Prescriptions New Prescriptions   AMOXICILLIN-CLAVULANATE (AUGMENTIN) 875-125 MG TABLET    Take 1 tablet by mouth 2 (two) times daily.     Vanetta Mulders, MD 08/31/16 [phone]

## 2016-08-31 NOTE — ED Notes (Signed)
Attempted IV x 2 , unsuccessful. Admits to IV drug abuse. ED MD informed
